# Patient Record
Sex: Female | Born: 1980
Health system: Southern US, Community
[De-identification: ages and names within clinical notes are randomized; demographics above are authoritative.]

## PROBLEM LIST (undated history)

## (undated) DIAGNOSIS — E282 Polycystic ovarian syndrome: Secondary | ICD-10-CM

## (undated) DIAGNOSIS — Z9289 Personal history of other medical treatment: Secondary | ICD-10-CM

## (undated) DIAGNOSIS — D649 Anemia, unspecified: Secondary | ICD-10-CM

## (undated) DIAGNOSIS — G4700 Insomnia, unspecified: Secondary | ICD-10-CM

## (undated) DIAGNOSIS — R51 Headache: Secondary | ICD-10-CM

## (undated) DIAGNOSIS — K219 Gastro-esophageal reflux disease without esophagitis: Secondary | ICD-10-CM

## (undated) HISTORY — DX: Insomnia, unspecified: G47.00

## (undated) HISTORY — PX: WISDOM TOOTH EXTRACTION: SHX21

## (undated) HISTORY — DX: Headache: R51

## (undated) HISTORY — PX: BREAST CYST ASPIRATION: SHX578

---

## 2002-09-25 HISTORY — PX: GASTRIC BYPASS: SHX52

## 2007-01-24 LAB — CONVERTED CEMR LAB: Pap Smear: NORMAL

## 2007-08-02 ENCOUNTER — Ambulatory Visit: Payer: Self-pay | Admitting: Nurse Practitioner

## 2007-08-02 DIAGNOSIS — R5381 Other malaise: Secondary | ICD-10-CM | POA: Insufficient documentation

## 2007-08-02 DIAGNOSIS — R5383 Other fatigue: Secondary | ICD-10-CM

## 2007-08-02 DIAGNOSIS — D638 Anemia in other chronic diseases classified elsewhere: Secondary | ICD-10-CM | POA: Insufficient documentation

## 2007-08-02 DIAGNOSIS — G4700 Insomnia, unspecified: Secondary | ICD-10-CM | POA: Insufficient documentation

## 2007-08-02 LAB — CONVERTED CEMR LAB
Eosinophils Relative: 1 % (ref 0–5)
HCT: 30.5 % — ABNORMAL LOW (ref 36.0–46.0)
Lymphocytes Relative: 53 % — ABNORMAL HIGH (ref 12–46)
Lymphs Abs: 1.7 10*3/uL (ref 0.7–3.3)
Monocytes Relative: 6 % (ref 3–11)
Neutro Abs: 1.3 10*3/uL — ABNORMAL LOW (ref 1.7–7.7)
Neutrophils Relative %: 39 % — ABNORMAL LOW (ref 43–77)
RBC: 4.06 M/uL (ref 3.87–5.11)
RDW: 18.3 % — ABNORMAL HIGH (ref 11.5–14.0)
Retic Ct Pct: 0.6 % (ref 0.4–3.1)
WBC: 3.3 10*3/uL — ABNORMAL LOW (ref 4.0–10.5)

## 2007-08-27 ENCOUNTER — Emergency Department (HOSPITAL_COMMUNITY): Admission: EM | Admit: 2007-08-27 | Discharge: 2007-08-27 | Payer: Self-pay | Admitting: Emergency Medicine

## 2007-09-09 ENCOUNTER — Ambulatory Visit: Payer: Self-pay | Admitting: Nurse Practitioner

## 2007-09-09 DIAGNOSIS — D519 Vitamin B12 deficiency anemia, unspecified: Secondary | ICD-10-CM | POA: Insufficient documentation

## 2007-09-10 LAB — CONVERTED CEMR LAB
Basophils Relative: 1 % (ref 0–1)
HCT: 30.6 % — ABNORMAL LOW (ref 36.0–46.0)
Lymphocytes Relative: 53 % — ABNORMAL HIGH (ref 12–46)
Lymphs Abs: 2.3 10*3/uL (ref 0.7–4.0)
MCHC: 29.4 g/dL — ABNORMAL LOW (ref 30.0–36.0)
Monocytes Absolute: 0.2 10*3/uL (ref 0.1–1.0)
Monocytes Relative: 5 % (ref 3–12)
Neutro Abs: 1.8 10*3/uL (ref 1.7–7.7)
RDW: 17.5 % — ABNORMAL HIGH (ref 11.5–15.5)

## 2007-10-25 ENCOUNTER — Ambulatory Visit: Payer: Self-pay | Admitting: Nurse Practitioner

## 2007-10-25 ENCOUNTER — Other Ambulatory Visit: Admission: RE | Admit: 2007-10-25 | Discharge: 2007-10-25 | Payer: Self-pay | Admitting: Family Medicine

## 2007-10-25 DIAGNOSIS — N760 Acute vaginitis: Secondary | ICD-10-CM | POA: Insufficient documentation

## 2007-10-25 LAB — CONVERTED CEMR LAB
Bilirubin Urine: NEGATIVE
Glucose, Urine, Semiquant: NEGATIVE
KOH Prep: NEGATIVE
Nitrite: NEGATIVE
Pap Smear: NORMAL
WBC Urine, dipstick: NEGATIVE

## 2007-10-28 ENCOUNTER — Telehealth (INDEPENDENT_AMBULATORY_CARE_PROVIDER_SITE_OTHER): Payer: Self-pay | Admitting: Nurse Practitioner

## 2007-10-28 ENCOUNTER — Encounter (INDEPENDENT_AMBULATORY_CARE_PROVIDER_SITE_OTHER): Payer: Self-pay | Admitting: Nurse Practitioner

## 2007-10-28 LAB — CONVERTED CEMR LAB
Basophils Absolute: 0 10*3/uL (ref 0.0–0.1)
Basophils Relative: 1 % (ref 0–1)
Eosinophils Relative: 1 % (ref 0–5)
GC Probe Amp, Genital: NEGATIVE
HCT: 31.8 % — ABNORMAL LOW (ref 36.0–46.0)
Hemoglobin: 9.5 g/dL — ABNORMAL LOW (ref 12.0–15.0)
Lymphocytes Relative: 46 % (ref 12–46)
Lymphs Abs: 2.3 10*3/uL (ref 0.7–4.0)
MCHC: 29.9 g/dL — ABNORMAL LOW (ref 30.0–36.0)
MCV: 75.7 fL — ABNORMAL LOW (ref 78.0–100.0)
Monocytes Absolute: 0.3 10*3/uL (ref 0.1–1.0)
Neutro Abs: 2.4 10*3/uL (ref 1.7–7.7)
Neutrophils Relative %: 48 % (ref 43–77)
RDW: 17.1 % — ABNORMAL HIGH (ref 11.5–15.5)
Vitamin B-12: 184 pg/mL — ABNORMAL LOW (ref 211–911)

## 2007-10-29 ENCOUNTER — Encounter (INDEPENDENT_AMBULATORY_CARE_PROVIDER_SITE_OTHER): Payer: Self-pay | Admitting: Nurse Practitioner

## 2007-11-04 ENCOUNTER — Telehealth (INDEPENDENT_AMBULATORY_CARE_PROVIDER_SITE_OTHER): Payer: Self-pay | Admitting: Nurse Practitioner

## 2007-11-05 ENCOUNTER — Telehealth (INDEPENDENT_AMBULATORY_CARE_PROVIDER_SITE_OTHER): Payer: Self-pay | Admitting: Nurse Practitioner

## 2008-01-30 ENCOUNTER — Ambulatory Visit: Payer: Self-pay | Admitting: Obstetrics and Gynecology

## 2008-03-17 ENCOUNTER — Emergency Department (HOSPITAL_COMMUNITY): Admission: EM | Admit: 2008-03-17 | Discharge: 2008-03-17 | Payer: Self-pay | Admitting: Emergency Medicine

## 2008-04-22 ENCOUNTER — Ambulatory Visit: Payer: Self-pay | Admitting: Obstetrics & Gynecology

## 2008-05-08 ENCOUNTER — Telehealth (INDEPENDENT_AMBULATORY_CARE_PROVIDER_SITE_OTHER): Payer: Self-pay | Admitting: Nurse Practitioner

## 2008-06-04 ENCOUNTER — Ambulatory Visit: Payer: Self-pay | Admitting: Nurse Practitioner

## 2008-06-04 DIAGNOSIS — L680 Hirsutism: Secondary | ICD-10-CM | POA: Insufficient documentation

## 2008-06-04 LAB — CONVERTED CEMR LAB
Basophils Absolute: 0 10*3/uL (ref 0.0–0.1)
Basophils Relative: 1 % (ref 0–1)
Eosinophils Absolute: 0 10*3/uL (ref 0.0–0.7)
Insulin: 4 microintl units/mL (ref 3–28)
Lymphocytes Relative: 45 % (ref 12–46)
Lymphs Abs: 1.8 10*3/uL (ref 0.7–4.0)
MCHC: 28.9 g/dL — ABNORMAL LOW (ref 30.0–36.0)
MCV: 77.9 fL — ABNORMAL LOW (ref 78.0–100.0)
Monocytes Absolute: 0.2 10*3/uL (ref 0.1–1.0)
Neutro Abs: 1.9 10*3/uL (ref 1.7–7.7)
Platelets: 337 10*3/uL (ref 150–400)
Sex Hormone Binding: 40 nmol/L (ref 18–114)
Vitamin B-12: 196 pg/mL — ABNORMAL LOW (ref 211–911)
WBC: 4 10*3/uL (ref 4.0–10.5)

## 2008-06-17 ENCOUNTER — Encounter (INDEPENDENT_AMBULATORY_CARE_PROVIDER_SITE_OTHER): Payer: Self-pay | Admitting: Nurse Practitioner

## 2008-10-12 ENCOUNTER — Encounter (INDEPENDENT_AMBULATORY_CARE_PROVIDER_SITE_OTHER): Payer: Self-pay | Admitting: Nurse Practitioner

## 2008-12-03 ENCOUNTER — Ambulatory Visit: Payer: Self-pay | Admitting: Obstetrics and Gynecology

## 2008-12-03 LAB — CONVERTED CEMR LAB: WBC, Wet Prep HPF POC: NONE SEEN

## 2008-12-28 ENCOUNTER — Emergency Department (HOSPITAL_COMMUNITY): Admission: EM | Admit: 2008-12-28 | Discharge: 2008-12-28 | Payer: Self-pay | Admitting: Emergency Medicine

## 2010-04-11 ENCOUNTER — Emergency Department (HOSPITAL_COMMUNITY): Admission: EM | Admit: 2010-04-11 | Discharge: 2010-04-11 | Payer: Self-pay | Admitting: Emergency Medicine

## 2010-04-26 ENCOUNTER — Inpatient Hospital Stay (HOSPITAL_COMMUNITY): Admission: AD | Admit: 2010-04-26 | Discharge: 2010-04-26 | Payer: Self-pay | Admitting: Obstetrics & Gynecology

## 2010-04-26 ENCOUNTER — Ambulatory Visit: Payer: Self-pay | Admitting: Gynecology

## 2011-01-20 ENCOUNTER — Emergency Department (HOSPITAL_COMMUNITY)
Admission: EM | Admit: 2011-01-20 | Discharge: 2011-01-20 | Disposition: A | Discharge status: Home / Self Care | Payer: PRIVATE HEALTH INSURANCE | Attending: Emergency Medicine | Admitting: Emergency Medicine

## 2011-01-20 DIAGNOSIS — N6459 Other signs and symptoms in breast: Secondary | ICD-10-CM | POA: Insufficient documentation

## 2011-01-20 DIAGNOSIS — N63 Unspecified lump in unspecified breast: Secondary | ICD-10-CM | POA: Insufficient documentation

## 2011-01-27 ENCOUNTER — Other Ambulatory Visit (HOSPITAL_COMMUNITY): Payer: Self-pay | Admitting: Obstetrics and Gynecology

## 2011-01-27 DIAGNOSIS — N6310 Unspecified lump in the right breast, unspecified quadrant: Secondary | ICD-10-CM

## 2011-01-30 ENCOUNTER — Other Ambulatory Visit (HOSPITAL_COMMUNITY): Payer: Self-pay | Admitting: Obstetrics and Gynecology

## 2011-01-30 ENCOUNTER — Ambulatory Visit
Admission: RE | Admit: 2011-01-30 | Discharge: 2011-01-30 | Disposition: A | Discharge status: Home / Self Care | Payer: PRIVATE HEALTH INSURANCE | Source: Ambulatory Visit | Attending: Obstetrics and Gynecology | Admitting: Obstetrics and Gynecology

## 2011-01-30 DIAGNOSIS — N6310 Unspecified lump in the right breast, unspecified quadrant: Secondary | ICD-10-CM

## 2011-01-31 ENCOUNTER — Other Ambulatory Visit (HOSPITAL_COMMUNITY): Payer: Self-pay | Admitting: Obstetrics and Gynecology

## 2011-01-31 ENCOUNTER — Ambulatory Visit
Admission: RE | Admit: 2011-01-31 | Discharge: 2011-01-31 | Disposition: A | Discharge status: Home / Self Care | Payer: PRIVATE HEALTH INSURANCE | Source: Ambulatory Visit | Attending: Obstetrics and Gynecology | Admitting: Obstetrics and Gynecology

## 2011-01-31 DIAGNOSIS — N6310 Unspecified lump in the right breast, unspecified quadrant: Secondary | ICD-10-CM

## 2011-02-07 NOTE — Group Therapy Note (Signed)
NAME:  Haley Martinez, EGGE NO.:  [id number]   MEDICAL RECORD NO.:  [id number]          PATIENT TYPE:  WOC   LOCATION:  [facility]                   FACILITY:  [facility]   PHYSICIAN:  Argentina Donovan, MD        DATE OF BIRTH:  Apr 06, 1981   DATE OF SERVICE:  12/03/2008                                  CLINIC NOTE   The patient is a 30 year old African American female who weighs 153  pounds and is 5 feet 5 inches tall.  She originally weighed 260 some  pounds.  She had a Roux-en-Y bypass where she was told she cannot any  longer trust the birth control pill.  She has tried NuvaRing and Mirena,  but because of side effects and/or financial reasons has stopped using  them.  She uses 100% use of condoms and has irritation and she thinks  that she may be allergic to the latex.  We have talked about that in  detail and found out today that it is true that you should not use oral  contraceptives after gastric bypass, so we have a problem with chronic  recurrent vaginal irritation, possibly due to the latex condoms.  I have  encouraged her to try the Lamb skin since she in a stable relationship  and it really is not a question or problem that she has to be concerned  about she says.  A wet prep was taken on examination.  External  genitalia was normal.  BUS within normal limits.  Vagina is clean and  well rugated with very little discharge.  The cervix is clean,  nulliparous and the uterus is anterior normal size, shape and  consistency.  The adnexa is normal.   IMPRESSION:  Vaginal irritation, possibly due to latex irritation.  I  told her to apply a Band-Aid to her arm or leg and see if she gets a  reaction to that, which is generally a latex preparation.  She is going  to try and do that and we will see whether she has any sign of  infection.           ______________________________  Argentina Donovan, MD     PR/MEDQ  D:  12/03/2008  T:  12/03/2008  Job:  [id number]

## 2011-02-07 NOTE — Group Therapy Note (Signed)
NAME:  Haley Martinez, Haley Martinez NO.:  [id number]   MEDICAL RECORD NO.:  [id number]          PATIENT TYPE:  WOC   LOCATION:  [facility]                   FACILITY:  [facility]   PHYSICIAN:  Johnella Moloney, MD        DATE OF BIRTH:  1981/02/07   DATE OF SERVICE:                                  CLINIC NOTE   The patient is 30 year old gravida 3, para 1-0-2-1 who was last seen on  Jan 29, 2098 for IUD removal and initiation of NuvaRing for birth  control.  The patient is here today reporting that since she has been on  NuvaRing, she has had increased weight gain of 7 pounds since May,  breast tenderness and some vaginal discharge.  The patient would like to  discuss alternative forms of birth control.  She is not interested in  taking any oral contraceptive pills and wants something either  transdermal or in the form of an ejection.  The patient has no other  concerns.   PHYSICAL EXAMINATION:  Temperature 98.7, blood pressure 117/74, weight  157.8 pounds, height 5 feet 5 inches.  GENERAL:  No apparent distress.  ABDOMEN:  Soft, nontender, nondistended.  PELVIC:  Examination with normal external female genitalia.  Small  amount of thick white discharge noted in vaginal vault.  No lesions.  Vagina was otherwise pink and well rugated.  Normal cervical contour.  No abnormal discharge noted.  Sample was taken for a wet prep of the  discharge.   ASSESSMENT/PLAN:  The patient is a 30 year old G3, P 1-0-2-1 here to  discuss issues with her current birth control.  She was told that it is  a normal side effect to have some weight gain or weight changes, breast  tenderness and also that vaginal discharge was common with the NuvaRing  given that it is as a foreign body in the vagina.  The patient was told  about other options including Depo-Provera and Implanon, but told that  these other modalities could also result in weight changes.  The patient  wants to continue the NuvaRing for now and  she also will use condoms for  STI prevention.  As for her vaginal discharge, was told that we will  follow up the results of the wet prep to rule out any pathology and she  should expect a call if the results are not normal.  The patient is to  follow up in the Gynecology Clinic for any further concerns.           ______________________________  Johnella Moloney, MD     UD/MEDQ  D:  04/22/2008  T:  04/22/2008  Job:  [id number]

## 2011-02-17 ENCOUNTER — Other Ambulatory Visit: Payer: Self-pay | Admitting: Obstetrics and Gynecology

## 2011-02-17 DIAGNOSIS — N6009 Solitary cyst of unspecified breast: Secondary | ICD-10-CM

## 2011-02-21 ENCOUNTER — Ambulatory Visit
Admission: RE | Admit: 2011-02-21 | Discharge: 2011-02-21 | Disposition: A | Discharge status: Home / Self Care | Payer: PRIVATE HEALTH INSURANCE | Source: Ambulatory Visit | Attending: Obstetrics and Gynecology | Admitting: Obstetrics and Gynecology

## 2011-02-21 ENCOUNTER — Other Ambulatory Visit: Payer: Self-pay | Admitting: Obstetrics and Gynecology

## 2011-02-21 DIAGNOSIS — N6009 Solitary cyst of unspecified breast: Secondary | ICD-10-CM

## 2011-03-07 ENCOUNTER — Other Ambulatory Visit: Payer: PRIVATE HEALTH INSURANCE

## 2011-03-10 ENCOUNTER — Other Ambulatory Visit: Payer: PRIVATE HEALTH INSURANCE

## 2011-03-30 ENCOUNTER — Other Ambulatory Visit: Payer: PRIVATE HEALTH INSURANCE

## 2011-04-03 ENCOUNTER — Ambulatory Visit
Admission: RE | Admit: 2011-04-03 | Discharge: 2011-04-03 | Disposition: A | Discharge status: Home / Self Care | Payer: PRIVATE HEALTH INSURANCE | Source: Ambulatory Visit | Attending: Obstetrics and Gynecology | Admitting: Obstetrics and Gynecology

## 2011-04-03 DIAGNOSIS — N6009 Solitary cyst of unspecified breast: Secondary | ICD-10-CM

## 2011-06-22 LAB — DIFFERENTIAL
Basophils Relative: 0
Blasts: 0
Monocytes Relative: 4
Myelocytes: 0
Promyelocytes Absolute: 0

## 2011-06-22 LAB — POCT I-STAT, CHEM 8
Chloride: 106
Creatinine, Ser: 0.8
Glucose, Bld: 82
HCT: 32 — ABNORMAL LOW
Hemoglobin: 10.9 — ABNORMAL LOW
Potassium: 3.9

## 2011-06-22 LAB — CBC
HCT: 29.6 — ABNORMAL LOW
Hemoglobin: 9.5 — ABNORMAL LOW
MCHC: 32.1
MCV: 75.5 — ABNORMAL LOW
RBC: 3.92
RDW: 19.1 — ABNORMAL HIGH
WBC: 4.6

## 2011-12-29 ENCOUNTER — Ambulatory Visit: Payer: PRIVATE HEALTH INSURANCE | Admitting: Family Medicine

## 2012-03-06 ENCOUNTER — Emergency Department (HOSPITAL_COMMUNITY)
Admission: EM | Admit: 2012-03-06 | Discharge: 2012-03-06 | Disposition: A | Discharge status: Home / Self Care | Payer: Medicaid Other | Attending: Emergency Medicine | Admitting: Emergency Medicine

## 2012-03-06 ENCOUNTER — Encounter (HOSPITAL_COMMUNITY): Payer: Self-pay | Admitting: Emergency Medicine

## 2012-03-06 DIAGNOSIS — Z9884 Bariatric surgery status: Secondary | ICD-10-CM | POA: Insufficient documentation

## 2012-03-06 DIAGNOSIS — N9089 Other specified noninflammatory disorders of vulva and perineum: Secondary | ICD-10-CM | POA: Insufficient documentation

## 2012-03-06 HISTORY — DX: Polycystic ovarian syndrome: E28.2

## 2012-03-06 MED ORDER — LIDOCAINE HCL 2 % EX GEL: CUTANEOUS | Status: AC | PRN

## 2012-03-06 NOTE — Discharge Instructions (Signed)
Your exam shows a fissure to your vulva. Sitz baths at home. Lidocaine topically for pain. No intercourse until all healed. Follow up with your doctor.

## 2012-03-06 NOTE — ED Provider Notes (Signed)
History     CSN: [id number]  Arrival date & time 03/06/12  1817   First MD Initiated Contact with Patient 03/06/12 1900      Chief Complaint  Patient presents with  . Vaginal Injury    (Consider location/radiation/quality/duration/timing/severity/associated sxs/prior treatment) Patient is a 31 y.o. female presenting with vaginal injury. The history is provided by the patient.  Vaginal Injury This is a new problem. The current episode started in the past 7 days. The problem occurs constantly. The problem has been unchanged. Pertinent negatives include no chills or fever.  Pt states she was having intercourse and felt pain in perineum. Reports pain now with palpation and unable to have intercourse. States saw some blood when wiping as well. No urinary symptoms. No abdominal pain. Normal bowel movements.   Past Medical History  Diagnosis Date  . Polycystic ovarian syndrome     Past Surgical History  Procedure Date  . Gastric bypass     History reviewed. No pertinent family history.  History  Substance Use Topics  . Smoking status: Never Smoker   . Smokeless tobacco: Not on file  . Alcohol Use: No    OB History    Grav Para Term Preterm Abortions TAB SAB Ect Mult Living                  Review of Systems  Constitutional: Negative for fever and chills.  Respiratory: Negative.   Cardiovascular: Negative.   Gastrointestinal: Negative for blood in stool.  Genitourinary: Positive for vaginal pain. Negative for dysuria, hematuria, vaginal bleeding, vaginal discharge and pelvic pain.  Skin: Positive for wound.    Allergies  Review of patient's allergies indicates no known allergies.  Home Medications   Current Outpatient Rx  Name Route Sig Dispense Refill  . IBUPROFEN 200 MG PO TABS Oral Take 400 mg by mouth every 8 (eight) hours as needed. For pain.      BP 112/69  Pulse 98  Temp 98.5 F (36.9 C) (Oral)  Resp 16  SpO2 100%  LMP 01/05/2012  Physical Exam    Nursing note and vitals reviewed. Constitutional: She is oriented to person, place, and time. She appears well-developed and well-nourished. No distress.  HENT:  Head: Normocephalic.  Eyes: Conjunctivae are normal.  Cardiovascular: Normal rate, regular rhythm and normal heart sounds.   Pulmonary/Chest: Effort normal and breath sounds normal. No respiratory distress. She has no wheezes.  Genitourinary:       Normal external genitlia, other than a 1cm fissure to the posterior vaginal opening over perineum. Hemostatic.  Neurological: She is alert and oriented to person, place, and time.  Skin: Skin is warm and dry.  Psychiatric: She has a normal mood and affect.    ED Course  Procedures (including critical care time)  Pt with perineal fissure. No bleeding at present. Fissure superficial, no repair required. Will treat with pain management, sitz baths, pelvic rest, follow up.  1. Fissure of vulva       MDM          Lottie Mussel, PA 03/07/12 [phone]

## 2012-03-06 NOTE — ED Notes (Signed)
Patient states that she was having sex with her partner 2 days ago and she feels like her labia has torn. She has had intermittent bleeding to the site since the injury

## 2012-03-08 NOTE — ED Provider Notes (Signed)
Medical screening examination/treatment/procedure(s) were performed by non-physician practitioner and as supervising physician I was immediately available for consultation/collaboration.  Flint Melter, MD 03/08/12 1002

## 2012-12-05 ENCOUNTER — Emergency Department (INDEPENDENT_AMBULATORY_CARE_PROVIDER_SITE_OTHER)
Admission: EM | Admit: 2012-12-05 | Discharge: 2012-12-05 | Disposition: A | Discharge status: Home / Self Care | Payer: Self-pay | Source: Home / Self Care

## 2012-12-05 ENCOUNTER — Encounter (HOSPITAL_COMMUNITY): Payer: Self-pay | Admitting: Emergency Medicine

## 2012-12-05 DIAGNOSIS — M778 Other enthesopathies, not elsewhere classified: Secondary | ICD-10-CM

## 2012-12-05 DIAGNOSIS — M79609 Pain in unspecified limb: Secondary | ICD-10-CM

## 2012-12-05 DIAGNOSIS — M79602 Pain in left arm: Secondary | ICD-10-CM

## 2012-12-05 DIAGNOSIS — M6580 Other synovitis and tenosynovitis, unspecified site: Secondary | ICD-10-CM

## 2012-12-05 HISTORY — DX: Anemia, unspecified: D64.9

## 2012-12-05 NOTE — ED Provider Notes (Signed)
History     CSN: [id number]  Arrival date & time 12/05/12  7829   First MD Initiated Contact with Patient 12/05/12 1854      Chief Complaint  Patient presents with  . Arm Pain    left arm pain    (Consider location/radiation/quality/duration/timing/severity/associated sxs/prior treatment) HPI Comments: 32 year old female who presents with a complaint of left antecubital fossa pain. This occurred at 4:00 this morning stating that her median pain was dilated and the area was cold to touch. There was also pain on the anterior surface of the elbow. This has occurred 2 other times in the remote past. The symptoms may last from 6-8 hours to 24 hours. She is currently not having any other symptoms. She has a history of anemia and  PCOS  Patient is a 32 y.o. female presenting with arm pain.  Arm Pain    Past Medical History  Diagnosis Date  . Polycystic ovarian syndrome   . Anemia     Past Surgical History  Procedure Laterality Date  . Gastric bypass      History reviewed. No pertinent family history.  History  Substance Use Topics  . Smoking status: Never Smoker   . Smokeless tobacco: Not on file  . Alcohol Use: No    OB History   Grav Para Term Preterm Abortions TAB SAB Ect Mult Living                  Review of Systems  Constitutional: Negative.   Respiratory: Negative.   Musculoskeletal: Positive for arthralgias.  Neurological: Negative.   Psychiatric/Behavioral: Negative.     Allergies  Review of patient's allergies indicates no known allergies.  Home Medications   Current Outpatient Rx  Name  Route  Sig  Dispense  Refill  . ibuprofen (ADVIL,MOTRIN) 200 MG tablet   Oral   Take 400 mg by mouth every 8 (eight) hours as needed. For pain.         Marland Kitchen lidocaine (XYLOCAINE JELLY) 2 % jelly   Topical   Apply topically as needed.   30 mL   0     BP 119/73  Pulse 74  Temp(Src) 98.6 F (37 C) (Oral)  Resp 16  SpO2 100%  LMP 09/25/2012  Physical  Exam  Nursing note and vitals reviewed. Constitutional: She is oriented to person, place, and time. She appears well-developed and well-nourished. No distress.  HENT:  Head: Normocephalic and atraumatic.  Eyes: EOM are normal.  Pulmonary/Chest: Effort normal. No respiratory distress.  Musculoskeletal:  Examination of left upper extremity is unremarkable. A. the only finding is mild enlargement of the antecubital median pain. Is also mild tenderness in the outset tendon. The skin is of note normal color and temperature. Distal neurovascular motor sensory is intact. Radial pulses 2+. There is no tenderness in the bilateral epicondyles, olecranon or other soft tissues. Full range of motion of the elbow and strength is 5 over 5.  Neurological: She is alert and oriented to person, place, and time. No cranial nerve deficit.  Skin: Skin is warm and dry.  Psychiatric: She has a normal mood and affect.    ED Course  Procedures (including critical care time)  Labs Reviewed - No data to display No results found.   1. Tendinitis of left elbow   2. Arm pain, anterior, left       MDM  Diagnosis is uncertain. She does have tenderness in the biceps tendon in the antecubital fossa. The exam  of the left elbow, upper  arm and forearm are normal with exception of the above tendinitis.Circulation is normal. Recommend that if the symptoms of coldness, vein dilatation and pain recurs to go to emergency department.      Hayden Rasmussen, NP 12/05/12 1949

## 2012-12-05 NOTE — ED Notes (Signed)
Pt states that she has a hx of irregular cycles and last cycle was in January.

## 2012-12-05 NOTE — ED Notes (Signed)
Pt c/o left arm pain that started today at 4 a.m. Pt states that the pain is severe and vein is bulging.  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

## 2012-12-06 NOTE — ED Provider Notes (Signed)
Medical screening examination/treatment/procedure(s) were performed by non-physician practitioner and as supervising physician I was immediately available for consultation/collaboration.  Leslee Home, M.D.  Reuben Likes, MD 12/06/12 [phone]

## 2014-01-05 ENCOUNTER — Ambulatory Visit: Payer: Self-pay | Attending: Internal Medicine

## 2014-04-17 ENCOUNTER — Encounter: Payer: Self-pay | Admitting: Nurse Practitioner

## 2014-04-17 ENCOUNTER — Ambulatory Visit (INDEPENDENT_AMBULATORY_CARE_PROVIDER_SITE_OTHER): Payer: Self-pay | Admitting: Nurse Practitioner

## 2014-04-17 VITALS — BP 104/52 | HR 71 | Temp 98.4°F | Ht 65.5 in | Wt 151.2 lb

## 2014-04-17 DIAGNOSIS — Z01419 Encounter for gynecological examination (general) (routine) without abnormal findings: Secondary | ICD-10-CM

## 2014-04-17 DIAGNOSIS — Z0000 Encounter for general adult medical examination without abnormal findings: Secondary | ICD-10-CM

## 2014-04-17 DIAGNOSIS — Z9189 Other specified personal risk factors, not elsewhere classified: Secondary | ICD-10-CM | POA: Insufficient documentation

## 2014-04-17 DIAGNOSIS — Z9884 Bariatric surgery status: Secondary | ICD-10-CM

## 2014-04-17 NOTE — Progress Notes (Signed)
History:  Haley Martinez is a 33 y.o. [phone] who presents to [facility] clinic today for well woman exam. She is currently sexually active with same partner, does not use birth control and does not want to use anything. She has PCOS and an irregular cycle. She may have 4-6 cycles per year. She had an abnormal pap in her teens, otherwise they have all been normal. She has had cyst in her breast removed. She denies any current issues with vagina or breasts. She had gastric bypass 9 years ago and has done a great job keeping the weight off.  The following portions of the patient's history were reviewed and updated as appropriate: allergies, current medications, past family history, past medical history, past social history, past surgical history and problem list.  Review of Systems:    Objective:  Physical Exam BP 104/52  Pulse 71  Temp(Src) 98.4 F (36.9 C) (Oral)  Ht 5' 5.5" (1.664 m)  Wt 151 lb 3.2 oz (68.584 kg)  BMI 24.77 kg/m2  LMP 03/15/2014 GENERAL: Well-developed, well-nourished female in no acute distress.  HEENT: Normocephalic, atraumatic.  NECK: Supple. Normal thyroid.  LUNGS: Normal rate. Clear to auscultation bilaterally.  HEART: Regular rate and rhythm with no adventitious sounds.  BREASTS: Symmetric in size. No masses, skin changes, nipple drainage, or lymphadenopathy. ABDOMEN: Soft, nontender, nondistended. No organomegaly. Normal bowel sounds appreciated in all quadrants.  PELVIC: Normal external female genitalia. Vagina is pink and rugated.  Discharge is thick white and clumpy. Normal cervix contour. Pap smear obtained. Uterus is normal in size. No adnexal mass or tenderness.  EXTREMITIES: No cyanosis, clubbing, or edema, 2+ distal pulses.   Labs and Imaging No results found.  Assessment & Plan:  Assessment: Well woman Exam Vaginal Yeast  Plans: Pap, wet prep, GC/ Chlamydia Will follow up with results  Olegario Messier, NP 04/17/2014 9:51 AM

## 2014-04-17 NOTE — Patient Instructions (Signed)
Pap Test A Pap test checks the cells on the surface of your cervix. Your doctor will look for cell changes that are not normal, an infection, or cancer. If the cells no longer look normal, it is called dysplasia. Dysplasia can turn into cancer. Regular Pap tests are important to stop cancer from developing. BEFORE THE PROCEDURE  Ask your doctor when to schedule your Pap test. Timing the test around your period may be important.  Do not douche or have sex (intercourse) for 24 hours before the test.  Do not put creams on your vagina or use tampons for 24 hours before the test.  Go pee (urinate) just before the test. PROCEDURE  You will lie on an exam table with your feet in stirrups.  A warm metal or plastic tool (speculum) will be put in your vagina to open it up.  Your doctor will use a small, plastic brush or wooden spatula to take cells from your cervix.  The cells will be put in a lab container.  The cells will be checked under a microscope to see if they are normal or not. AFTER THE PROCEDURE Get your test results. If they are abnormal, you may need more tests. Document Released: 10/14/2010 Document Revised: 12/04/2011 Document Reviewed: 09/07/2011 Exit[employer] Patient Information 2015 [employer]. This information is not intended to replace advice given to you by your health care provider. Make sure you discuss any questions you have with your health care provider.  

## 2014-04-18 LAB — WET PREP, GENITAL
Clue Cells Wet Prep HPF POC: NONE SEEN
Trich, Wet Prep: NONE SEEN
YEAST WET PREP: NONE SEEN

## 2014-04-21 LAB — CYTOLOGY - PAP

## 2014-04-24 ENCOUNTER — Telehealth: Payer: Self-pay | Admitting: *Deleted

## 2014-04-24 NOTE — Telephone Encounter (Signed)
Haley Martinez called and left a message stating she was seen last week and had a papsmear/ well check up and states provider talked to her about getting a birth control pill to help regulate her period but she declined.  But states now she wants to know if it would help with the cramping and heavy bleeding she has- states if it does she would like to get it. Called Haley Martinez and we discussed it would help with cramps and bleeding.  She does not have insurance and would want something from [facility]. Then Haley Martinez did voice that she has had gastric bypass and her surgeon had told her she would need to find another method of birth control because her system would not be able to absorb it.  I informed her that  There are other options, and I would route this message to her provider and see what she reccomends and that she may need to come in for appt to discuss. Informed her we will call her after we hear back from provider.

## 2014-04-25 DIAGNOSIS — Z9289 Personal history of other medical treatment: Secondary | ICD-10-CM

## 2014-04-25 HISTORY — DX: Personal history of other medical treatment: Z92.89

## 2014-05-05 NOTE — Telephone Encounter (Addendum)
Pt left new message stating that she has not heard a response to her questions about birth control from her call on 7/31/.  Please call back. She can be reached at [phone].  8/12  1645  Spoke w/Dr. Roselie Awkward regarding pt's question. He stated that pt may try Nuvaring if she would prefer or she may contact her General Surgeon for other birth control suggestions/advice.  Diane Day RNC

## 2014-05-08 NOTE — Telephone Encounter (Signed)
Called Haley Martinez and notified her we had spoke with 2 different providers and they state she can probably have the IUD, nuvaring, or depo-provera but advise her to ask her GI which would they recommend.  She states that her GI provider had told her IUd was best and she had one before but had to take it out once it had been in 5 years.  She is interested in getting another IUD, but does not have insurance , only has orange card- I informed her that does not cover IUD- but she can fill out application for IUD thru [employer] and will need to provide proof of income. Also informed her she would still be resposible for insertion fee which is around $200. She will come by on Tuesday 8/18 1:03 to fill out application and pick up letter re: proof of income.

## 2014-05-08 NOTE — Telephone Encounter (Signed)
Patient called stating she has called three times in the past 2 weeks and she got a callback the first time but hasn't heard anything back in response about her birth control or about her test results and would like a call back today about this and to see if she needs to come in and be seen. Left call back # [phone]. Called patient, no answer- left message that I am trying to return your phone call, please call us back at the clinics

## 2014-05-12 ENCOUNTER — Encounter: Payer: Self-pay | Admitting: Internal Medicine

## 2014-05-12 ENCOUNTER — Ambulatory Visit: Payer: Self-pay | Attending: Internal Medicine | Admitting: Internal Medicine

## 2014-05-12 VITALS — BP 128/84 | HR 70 | Temp 99.0°F | Resp 17 | Wt 151.0 lb

## 2014-05-12 DIAGNOSIS — N926 Irregular menstruation, unspecified: Secondary | ICD-10-CM | POA: Insufficient documentation

## 2014-05-12 DIAGNOSIS — D649 Anemia, unspecified: Secondary | ICD-10-CM | POA: Insufficient documentation

## 2014-05-12 DIAGNOSIS — Z139 Encounter for screening, unspecified: Secondary | ICD-10-CM

## 2014-05-12 DIAGNOSIS — N920 Excessive and frequent menstruation with regular cycle: Secondary | ICD-10-CM | POA: Insufficient documentation

## 2014-05-12 DIAGNOSIS — Z9884 Bariatric surgery status: Secondary | ICD-10-CM | POA: Insufficient documentation

## 2014-05-12 DIAGNOSIS — D509 Iron deficiency anemia, unspecified: Secondary | ICD-10-CM | POA: Insufficient documentation

## 2014-05-12 DIAGNOSIS — N921 Excessive and frequent menstruation with irregular cycle: Secondary | ICD-10-CM

## 2014-05-12 DIAGNOSIS — G4700 Insomnia, unspecified: Secondary | ICD-10-CM | POA: Insufficient documentation

## 2014-05-12 LAB — LIPID PANEL
CHOL/HDL RATIO: 2.6 ratio
CHOLESTEROL: 179 mg/dL (ref 0–200)
HDL: 68 mg/dL (ref >39)
LDL Cholesterol: 98 mg/dL (ref 0–99)
TRIGLYCERIDES: 67 mg/dL (ref <150)
VLDL: 13 mg/dL (ref 0–40)

## 2014-05-12 LAB — COMPLETE METABOLIC PANEL WITH GFR
ALK PHOS: 59 U/L (ref 39–117)
ALT: 19 U/L (ref 0–35)
AST: 27 U/L (ref 0–37)
Albumin: 4.3 g/dL (ref 3.5–5.2)
BILIRUBIN TOTAL: 0.7 mg/dL (ref 0.2–1.2)
BUN: 6 mg/dL (ref 6–23)
CALCIUM: 8.7 mg/dL (ref 8.4–10.5)
CHLORIDE: 102 meq/L (ref 96–112)
CO2: 27 mEq/L (ref 19–32)
CREATININE: 0.61 mg/dL (ref 0.50–1.10)
GFR, Est African American: >89 mL/min
GFR, Est Non African American: >89 mL/min
Glucose, Bld: 82 mg/dL (ref 70–99)
Potassium: 4.5 mEq/L (ref 3.5–5.3)
Sodium: 136 mEq/L (ref 135–145)
Total Protein: 7.2 g/dL (ref 6.0–8.3)

## 2014-05-12 LAB — TSH: TSH: 1.185 u[IU]/mL (ref 0.350–4.500)

## 2014-05-12 MED ORDER — TRAZODONE HCL 50 MG PO TABS: 25.0000 mg | ORAL_TABLET | Freq: Every evening | ORAL | Status: DC | PRN

## 2014-05-12 NOTE — Progress Notes (Signed)
Patient Demographics  Haley Martinez, is a 33 y.o. female  UVO:[id number]  VQQ:[id number]  DOB - [birth year]  CC:  Chief Complaint  Patient presents with  . Establish Care       HPI: Haley Martinez is a 33 y.o. female here today to establish medical care. Patient has history of gastric bypass and subsequent  anemia as per patient she cannot tolerate iron supplement, she was advised in the past to see a hematologist, as per patient due to insurance issues she has not been able, she denies any headache or dizziness does complain of craving for ice and also reported menorrhagia she, she is following up with gynecologist and is in the process to get Mirena insertion. Patient also reported to have lot of problem with the sleep, she has tried over-the-counter medications which does not help. Patient has No headache, No chest pain, No abdominal pain - No Nausea, No new weakness tingling or numbness, No Cough - SOB.  No Known Allergies Past Medical History  Diagnosis Date  . Polycystic ovarian syndrome   . Anemia   . Insomnia   . Restless leg syndrome    Current Outpatient Prescriptions on File Prior to Visit  Medication Sig Dispense Refill  . ibuprofen (ADVIL,MOTRIN) 200 MG tablet Take 400 mg by mouth every 8 (eight) hours as needed. For pain.       No current facility-administered medications on file prior to visit.   Family History  Problem Relation Age of Onset  . Heart disease Mother   . Hyperlipidemia Mother   . Hypertension Mother   . Hyperlipidemia Father   . Hypertension Father   . Cancer Maternal Uncle   . Drug abuse Maternal Grandmother   . Drug abuse Paternal Grandmother   . Stroke Paternal Grandfather    History   Social History  . Marital Status: Single    Spouse Name: N/A    Number of Children: N/A  . Years of Education: N/A   Occupational History  . Not on file.   Social History Main Topics  . Smoking status: Never Smoker   . Smokeless tobacco: Never  Used  . Alcohol Use: Yes     Comment: occasionally  . Drug Use: No  . Sexual Activity: Yes    Birth Control/ Protection: None   Other Topics Concern  . Not on file   Social History Narrative  . No narrative on file    Review of Systems: Constitutional: Negative for fever, chills, diaphoresis, activity change, appetite change and fatigue. HENT: Negative for ear pain, nosebleeds, congestion, facial swelling, rhinorrhea, neck pain, neck stiffness and ear discharge.  Eyes: Negative for pain, discharge, redness, itching and visual disturbance. Respiratory: Negative for cough, choking, chest tightness, shortness of breath, wheezing and stridor.  Cardiovascular: Negative for chest pain, palpitations and leg swelling. Gastrointestinal: Negative for abdominal distention. Genitourinary: Negative for dysuria, urgency, frequency, hematuria, flank pain, decreased urine volume, difficulty urinating and dyspareunia.  Musculoskeletal: Negative for back pain, joint swelling, arthralgia and gait problem. Neurological: Negative for dizziness, tremors, seizures, syncope, facial asymmetry, speech difficulty, weakness, light-headedness, numbness and headaches.  Hematological: Negative for adenopathy. Does not bruise/bleed easily. Psychiatric/Behavioral: Negative for hallucinations, behavioral problems, confusion, dysphoric mood, decreased concentration and agitation.    Objective:   Filed Vitals:   05/12/14 1531  BP: 128/84  Pulse: 70  Temp: 99 F (37.2 C)  Resp: 17    Physical Exam: Constitutional: Patient appears well-developed and well-nourished. No distress.  HENT: Normocephalic, atraumatic, External right and left ear normal. Oropharynx is clear and moist.  Eyes: Conjunctivae and EOM are normal. PERRLA, no scleral icterus. Neck: Normal ROM. Neck supple. No JVD. No tracheal deviation. No thyromegaly. CVS: RRR, S1/S2 +, no murmurs, no gallops, no carotid bruit.  Pulmonary: Effort and breath  sounds normal, no stridor, rhonchi, wheezes, rales.  Abdominal: Soft. BS +, no distension, tenderness, rebound or guarding.  Musculoskeletal: Normal range of motion. No edema and no tenderness.  Neuro: Alert. Normal reflexes, muscle tone coordination. No cranial nerve deficit. Skin: Skin is warm and dry. No rash noted. Not diaphoretic. No erythema. No pallor. Psychiatric: Normal mood and affect. Behavior, judgment, thought content normal.  Lab Results  Component Value Date   WBC 4.0 06/04/2008   HGB 9.2* 06/04/2008   HCT 31.8* 06/04/2008   MCV 77.9* 06/04/2008   PLT 337 06/04/2008   Lab Results  Component Value Date   CREATININE 0.8 03/17/2008   BUN 9 03/17/2008   NA 139 03/17/2008   K 3.9 03/17/2008   CL 106 03/17/2008    No results found for this basename: HGBA1C   Lipid Panel  No results found for this basename: chol, trig, hdl, cholhdl, vldl, ldlcalc       Assessment and plan:   1. Anemia, unspecified/2. History of gastric bypass Since she has history of gastric bypass with a problem with absorption, she probably needs  iron infusion, I have referred her to hematology. - Ambulatory referral to Hematology - Will repeat her CBC with Differential   3. Menorrhagia with irregular cycle Following up with her gynecologist.  4. Screening Ordered baseline blood work. - COMPLETE METABOLIC PANEL WITH GFR - TSH - Lipid panel - Vit D  25 hydroxy (rtn osteoporosis monitoring)  5. Insomnia Advised patient for sleep hygiene, prescribed positive to use when necessary. - traZODone (DESYREL) 50 MG tablet; Take 0.5-1 tablets (25-50 mg total) by mouth at bedtime as needed for sleep.  Dispense: 30 tablet; Refill: 3  Return in about 3 months (around 08/12/2014) for anemia.   Lorayne Marek, MD

## 2014-05-12 NOTE — Progress Notes (Signed)
Patient here to establish care Has history of gastric by pass Restless leg syndrome and anemia

## 2014-05-13 ENCOUNTER — Telehealth: Payer: Self-pay

## 2014-05-13 LAB — CBC WITH DIFFERENTIAL/PLATELET
BASOS PCT: 1 % (ref 0–1)
Basophils Absolute: 0 10*3/uL (ref 0.0–0.1)
EOS ABS: 0 10*3/uL (ref 0.0–0.7)
EOS PCT: 1 % (ref 0–5)
HEMATOCRIT: 22.6 % — AB (ref 36.0–46.0)
Hemoglobin: 6.4 g/dL — CL (ref 12.0–15.0)
Lymphocytes Relative: 66 % — ABNORMAL HIGH (ref 12–46)
Lymphs Abs: 2.8 10*3/uL (ref 0.7–4.0)
MCH: 18.5 pg — AB (ref 26.0–34.0)
MCHC: 28.3 g/dL — AB (ref 30.0–36.0)
MCV: 65.3 fL — AB (ref 78.0–100.0)
MONO ABS: 0.2 10*3/uL (ref 0.1–1.0)
Monocytes Relative: 4 % (ref 3–12)
Neutro Abs: 1.2 10*3/uL — ABNORMAL LOW (ref 1.7–7.7)
Neutrophils Relative %: 28 % — ABNORMAL LOW (ref 43–77)
Platelets: 73 10*3/uL — ABNORMAL LOW (ref 150–400)
RBC: 3.46 MIL/uL — ABNORMAL LOW (ref 3.87–5.11)
RDW: 25.6 % — ABNORMAL HIGH (ref 11.5–15.5)
WBC: 4.3 10*3/uL (ref 4.0–10.5)

## 2014-05-13 LAB — VITAMIN D 25 HYDROXY (VIT D DEFICIENCY, FRACTURES): Vit D, 25-Hydroxy: <10 ng/mL — ABNORMAL LOW (ref 30–89)

## 2014-05-13 NOTE — Telephone Encounter (Signed)
Spoke with patient this am about her hemoglobin Patient has a history of chronic anemia Patient complains of still feeling very tired Advised her is she is not feeling any better , to go to the ED Will most likely need a blood transfusion or IV iron per Dr Annitta Needs Referral coordinator is working on getting her seen by hematologist ASAP

## 2014-05-13 NOTE — Telephone Encounter (Signed)
Spoke with patient in reference to her oncology appt Haley Martinez our referral coordinator put it in as urgent request and  Their office should be calling patient today to schedule her an appointment

## 2014-05-13 NOTE — Telephone Encounter (Signed)
Message copied by Dorothe Pea on Wed May 13, 2014  9:38 AM ------      Message from: Lorayne Marek      Created: Wed May 13, 2014  9:31 AM       Call and let the patient know that her hemoglobin is low 6.4 compared to 5 years ago it was 9.2, she has history of gastric bypass and has chronic anemia, patient was seen yesterday, help patient to schedule appointment with hematology as soon as possible. If she has any worsening symptoms advise patient to go to the ER then she will need to get blood transfusion.       noticed low vitamin D, call patient advise to start ergocalciferol 50,000 units once a week for the duration of  12 weeks.            Marland Kitchen ------

## 2014-05-14 ENCOUNTER — Other Ambulatory Visit (HOSPITAL_COMMUNITY)
Admission: RE | Admit: 2014-05-14 | Discharge: 2014-05-14 | Disposition: A | Discharge status: Home / Self Care | Payer: Self-pay | Source: Ambulatory Visit | Attending: Hematology and Oncology | Admitting: Hematology and Oncology

## 2014-05-14 ENCOUNTER — Encounter: Payer: Self-pay | Admitting: Hematology and Oncology

## 2014-05-14 ENCOUNTER — Telehealth: Payer: Self-pay | Admitting: *Deleted

## 2014-05-14 ENCOUNTER — Encounter: Payer: Self-pay | Admitting: *Deleted

## 2014-05-14 ENCOUNTER — Telehealth: Payer: Self-pay | Admitting: Hematology and Oncology

## 2014-05-14 ENCOUNTER — Ambulatory Visit (HOSPITAL_BASED_OUTPATIENT_CLINIC_OR_DEPARTMENT_OTHER): Payer: Self-pay | Admitting: Hematology and Oncology

## 2014-05-14 ENCOUNTER — Non-Acute Institutional Stay (HOSPITAL_COMMUNITY)
Admission: AD | Admit: 2014-05-14 | Discharge: 2014-05-14 | Disposition: A | Discharge status: Home / Self Care | Payer: Self-pay | Source: Ambulatory Visit | Attending: Hematology and Oncology | Admitting: Hematology and Oncology

## 2014-05-14 ENCOUNTER — Ambulatory Visit: Payer: Self-pay

## 2014-05-14 ENCOUNTER — Ambulatory Visit (HOSPITAL_COMMUNITY)
Admission: RE | Admit: 2014-05-14 | Discharge: 2014-05-14 | Disposition: A | Discharge status: Home / Self Care | Payer: Self-pay | Source: Ambulatory Visit | Attending: Hematology and Oncology | Admitting: Hematology and Oncology

## 2014-05-14 ENCOUNTER — Ambulatory Visit (HOSPITAL_BASED_OUTPATIENT_CLINIC_OR_DEPARTMENT_OTHER): Payer: Self-pay | Admitting: Lab

## 2014-05-14 VITALS — BP 120/61 | HR 75 | Temp 98.8°F | Resp 18 | Ht 65.5 in | Wt 154.0 lb

## 2014-05-14 DIAGNOSIS — E538 Deficiency of other specified B group vitamins: Secondary | ICD-10-CM

## 2014-05-14 DIAGNOSIS — E559 Vitamin D deficiency, unspecified: Secondary | ICD-10-CM | POA: Insufficient documentation

## 2014-05-14 DIAGNOSIS — D638 Anemia in other chronic diseases classified elsewhere: Secondary | ICD-10-CM | POA: Insufficient documentation

## 2014-05-14 DIAGNOSIS — K5900 Constipation, unspecified: Secondary | ICD-10-CM | POA: Insufficient documentation

## 2014-05-14 DIAGNOSIS — D509 Iron deficiency anemia, unspecified: Secondary | ICD-10-CM

## 2014-05-14 DIAGNOSIS — K5901 Slow transit constipation: Secondary | ICD-10-CM

## 2014-05-14 DIAGNOSIS — G2581 Restless legs syndrome: Secondary | ICD-10-CM | POA: Insufficient documentation

## 2014-05-14 DIAGNOSIS — Z9884 Bariatric surgery status: Secondary | ICD-10-CM

## 2014-05-14 LAB — CBC & DIFF AND RETIC
BASO%: 0.3 % (ref 0.0–2.0)
BASOS ABS: 0 10*3/uL (ref 0.0–0.1)
EOS%: 0.9 % (ref 0.0–7.0)
Eosinophils Absolute: 0 10*3/uL (ref 0.0–0.5)
HEMATOCRIT: 23.7 % — AB (ref 34.8–46.6)
HEMOGLOBIN: 6.4 g/dL — AB (ref 11.6–15.9)
IMMATURE RETIC FRACT: 24.8 % — AB (ref 1.60–10.00)
LYMPH%: 52.3 % — ABNORMAL HIGH (ref 14.0–49.7)
MCH: 18.8 pg — ABNORMAL LOW (ref 25.1–34.0)
MCHC: 27 g/dL — ABNORMAL LOW (ref 31.5–36.0)
MCV: 69.7 fL — ABNORMAL LOW (ref 79.5–101.0)
MONO#: 0.2 10*3/uL (ref 0.1–0.9)
MONO%: 4.9 % (ref 0.0–14.0)
NEUT#: 1.5 10*3/uL (ref 1.5–6.5)
NEUT%: 41.6 % (ref 38.4–76.8)
Platelets: 45 10*3/uL — ABNORMAL LOW (ref 145–400)
RBC: 3.4 10*6/uL — ABNORMAL LOW (ref 3.70–5.45)
RDW: 26 % — AB (ref 11.2–14.5)
Retic %: 0.72 % (ref 0.70–2.10)
Retic Ct Abs: 24.48 10*3/uL — ABNORMAL LOW (ref 33.70–90.70)
WBC: 3.5 10*3/uL — ABNORMAL LOW (ref 3.9–10.3)
lymph#: 1.8 10*3/uL (ref 0.9–3.3)

## 2014-05-14 LAB — ABO/RH: ABO/RH(D): O POS

## 2014-05-14 LAB — HOLD TUBE, BLOOD BANK

## 2014-05-14 LAB — CHCC SMEAR

## 2014-05-14 LAB — PREPARE RBC (CROSSMATCH)

## 2014-05-14 MED ORDER — POLYETHYLENE GLYCOL 3350 17 G PO PACK: 17.0000 g | PACK | Freq: Every day | ORAL | Status: DC

## 2014-05-14 MED ORDER — HEPARIN SOD (PORK) LOCK FLUSH 100 UNIT/ML IV SOLN: 250.0000 [IU] | INTRAVENOUS | Status: DC | PRN

## 2014-05-14 MED ORDER — SODIUM CHLORIDE 0.9 % IV SOLN: 250.0000 mL | Freq: Once | INTRAVENOUS | Status: DC

## 2014-05-14 MED ORDER — SODIUM CHLORIDE 0.9 % IJ SOLN: 10.0000 mL | INTRAMUSCULAR | Status: DC | PRN

## 2014-05-14 MED ORDER — SODIUM CHLORIDE 0.9 % IJ SOLN: 3.0000 mL | INTRAMUSCULAR | Status: DC | PRN

## 2014-05-14 MED ORDER — HEPARIN SOD (PORK) LOCK FLUSH 100 UNIT/ML IV SOLN: 500.0000 [IU] | Freq: Every day | INTRAVENOUS | Status: DC | PRN

## 2014-05-14 MED ORDER — SODIUM CHLORIDE 0.9 % IV SOLN
1020.0000 mg | Freq: Once | INTRAVENOUS | Status: AC
  Administered 2014-05-14: 1020 mg via INTRAVENOUS
  Filled 2014-05-14: qty 34

## 2014-05-14 MED ADMIN — Cyanocobalamin Inj 1000 MCG/ML: 1000 ug | INTRAMUSCULAR | NDC 63323004401

## 2014-05-14 MED FILL — Cyanocobalamin Inj 1000 MCG/ML: INTRAMUSCULAR | Qty: 1 | Status: AC

## 2014-05-14 NOTE — Progress Notes (Signed)
Checked in new pt with no financial concerns. °

## 2014-05-14 NOTE — Telephone Encounter (Signed)
Per LPN I have called and scheduled patinet for injection tomorrow. Patient aware of time    JMW

## 2014-05-14 NOTE — Telephone Encounter (Signed)
Pt confirmed labs/ov per 08/20 POF, cld Sharyn Lull to add 1 unit of blood & IV iron today...  gave pt AVS....KJ

## 2014-05-14 NOTE — Telephone Encounter (Signed)
S/W PATIENT AND GAVE NP APPT FOR 08/20 @ 11:15 W/DR. [place]

## 2014-05-14 NOTE — Assessment & Plan Note (Signed)
We discussed some of the risks, benefits, and alternatives of blood transfusions. The patient is symptomatic from anemia and the hemoglobin level is critically low.  Some of the side-effects to be expected including risks of transfusion reactions, chills, infection, syndrome of volume overload and risk of hospitalization from various reasons and the patient is willing to proceed and went ahead to sign consent today. I will proceed with one unit of blood transfusion. The most likely cause of her anemia is due to chronic blood loss/malabsorption syndrome and mineral deficiencies. We discussed some of the risks, benefits, and alternatives of intravenous iron infusions. The patient is symptomatic from anemia and the iron level is critically low. She tolerated oral iron supplement poorly and desires to achieved higher levels of iron faster for adequate hematopoesis. Some of the side-effects to be expected including risks of infusion reactions, phlebitis, headaches, nausea and fatigue.  The patient is willing to proceed. Patient education material was dispensed.  Goal is to keep ferritin level greater than 50

## 2014-05-14 NOTE — Assessment & Plan Note (Addendum)
She has multiple mineral deficiencies. In addition to vitamin B12 and iron replacement, I recommend high-dose multivitamin supplements.

## 2014-05-14 NOTE — H&P (Signed)
She is here for transfusion only 

## 2014-05-14 NOTE — Progress Notes (Signed)
[name] CONSULT NOTE  Patient Care Team: Lorayne Marek, MD as PCP - General (Internal Medicine) Heath Lark, MD as Consulting Physician (Hematology and Oncology)  CHIEF COMPLAINTS/PURPOSE OF CONSULTATION:  Severe anemia and mineral deficiency  HISTORY OF PRESENTING ILLNESS:  Haley Martinez 33 y.o. female is here because of abnormal blood work.  She was found to have abnormal CBC from recent blood work from her primary care physician's office. The total white blood cell count was 4.3, hemoglobin 6.4, MCV of 65.3 and platelet count of 73,000. She also has severe vitamin D deficiency, less than 10. She denies chest pain but she has severe shortness of breath on minimal exertion, pre-syncopal episodes, palpitations leg cramps and restless leg syndrome. She had not noticed any recent bleeding such as epistaxis, hematuria or hematochezia. She has heavy menstruation. The patient denies over the counter NSAID ingestion. She is not on antiplatelets agents. She has history of gastric bypass surgery 9 what years ago and has lost 110 pounds of weight. She new of prior history of mineral deficiency is but were not taking any form of vitamin replacement therapy consistently. She had no prior history or diagnosis of cancer. Her age appropriate screening programs are up-to-date. She has excessive pica with chewing of ice. She eats a variety of diet. She never donated blood. She had received blood transfusion in the past during her gastric bypass surgery. The patient was prescribed oral iron supplements but she is not taking it due to severe diarrhea with oral iron supplement The patient also complained of insomnia. She is constipated for one week.  MEDICAL HISTORY:  Past Medical History  Diagnosis Date  . Polycystic ovarian syndrome   . Anemia   . Insomnia   . Restless leg syndrome   . Constipation 05/14/2014    SURGICAL HISTORY: Past Surgical History  Procedure Laterality Date   . Gastric bypass      SOCIAL HISTORY: History   Social History  . Marital Status: Single    Spouse Name: N/A    Number of Children: N/A  . Years of Education: N/A   Occupational History  . Not on file.   Social History Main Topics  . Smoking status: Never Smoker   . Smokeless tobacco: Never Used  . Alcohol Use: Yes     Comment: occasionally  . Drug Use: No  . Sexual Activity: Yes    Birth Control/ Protection: None   Other Topics Concern  . Not on file   Social History Narrative  . No narrative on file    FAMILY HISTORY: Family History  Problem Relation Age of Onset  . Heart disease Mother   . Hyperlipidemia Mother   . Hypertension Mother   . Hyperlipidemia Father   . Hypertension Father   . Cancer Maternal Uncle     pancreatic ca  . Drug abuse Maternal Grandmother   . Drug abuse Paternal Grandmother   . Stroke Paternal Grandfather     ALLERGIES:  has No Known Allergies.  MEDICATIONS:  No current facility-administered medications for this visit.   Current Outpatient Prescriptions  Medication Sig Dispense Refill  . polyethylene glycol (MIRALAX) packet Take 17 g by mouth daily.  14 each  8   Facility-Administered Medications Ordered in Other Visits  Medication Dose Route Frequency Provider Last Rate Last Dose  . 0.9 %  sodium chloride infusion  250 mL Intravenous Once Heath Lark, MD      . heparin lock flush 100  unit/mL  500 Units Intracatheter Daily PRN Heath Lark, MD      . heparin lock flush 100 unit/mL  250 Units Intracatheter PRN Heath Lark, MD      . sodium chloride 0.9 % injection 10 mL  10 mL Intracatheter PRN Heath Lark, MD      . sodium chloride 0.9 % injection 3 mL  3 mL Intracatheter PRN Heath Lark, MD        REVIEW OF SYSTEMS:   Constitutional: Denies fevers, chills or abnormal night sweats Eyes: Denies blurriness of vision, double vision or watery eyes Ears, nose, mouth, throat, and face: Denies mucositis or sore throat Respiratory:  Denies cough, dyspnea or wheezes Cardiovascular: Denies palpitation, chest discomfort or lower extremity swelling Skin: Denies abnormal skin rashes Lymphatics: Denies new lymphadenopathy or easy bruising Neurological:Denies numbness, tingling or new weaknesses Behavioral/Psych: Mood is stable, no new changes  All other systems were reviewed with the patient and are negative.  PHYSICAL EXAMINATION: ECOG PERFORMANCE STATUS: 1 - Symptomatic but completely ambulatory  Filed Vitals:   05/14/14 1118  BP: 120/61  Pulse: 75  Temp: 98.8 F (37.1 C)  Resp: 18   Filed Weights   05/14/14 1118  Weight: 154 lb (69.854 kg)    GENERAL:alert, no distress and comfortable SKIN: skin color, texture, turgor are normal, no rashes or significant lesions. Loose skin is noted EYES: normal, conjunctiva are pale and non-injected, sclera clear OROPHARYNX:no exudate, no erythema and lips, buccal mucosa, and tongue normal  NECK: supple, thyroid normal size, non-tender, without nodularity LYMPH:  no palpable lymphadenopathy in the cervical, axillary or inguinal LUNGS: clear to auscultation and percussion with normal breathing effort HEART: regular rate & rhythm and no murmurs and no lower extremity edema ABDOMEN:abdomen soft, non-tender and normal bowel sounds Musculoskeletal:no cyanosis of digits and no clubbing  PSYCH: alert & oriented x 3 with fluent speech NEURO: no focal motor/sensory deficits  LABORATORY DATA:  I have reviewed the data as listed Recent Results (from the past 2160 hour(s))  CYTOLOGY - PAP     Status: None   Collection Time    04/17/14 12:00 AM      Result Value Ref Range   CYTOLOGY - PAP PAP RESULT    WET PREP, GENITAL     Status: None   Collection Time    04/17/14 10:30 AM      Result Value Ref Range   Yeast Wet Prep HPF POC NONE SEEN  NONE SEEN   Trich, Wet Prep NONE SEEN  NONE SEEN   Clue Cells Wet Prep HPF POC NONE SEEN  NONE SEEN   WBC, Wet Prep HPF POC FEW  NONE SEEN   CBC WITH DIFFERENTIAL     Status: Abnormal   Collection Time    05/12/14  4:01 PM      Result Value Ref Range   WBC 4.3  4.0 - 10.5 K/uL   RBC 3.46 (*) 3.87 - 5.11 MIL/uL   Hemoglobin 6.4 (*) 12.0 - 15.0 g/dL   Comment: Result repeated and verified.   HCT 22.6 (*) 36.0 - 46.0 %   MCV 65.3 (*) 78.0 - 100.0 fL   MCH 18.5 (*) 26.0 - 34.0 pg   MCHC 28.3 (*) 30.0 - 36.0 g/dL   RDW 25.6 (*) 11.5 - 15.5 %   Platelets 73 (*) 150 - 400 K/uL   Comment: Platelet count confirmed on smear   Neutrophils Relative % 28 (*) 43 - 77 %  Neutro Abs 1.2 (*) 1.7 - 7.7 K/uL   Lymphocytes Relative 66 (*) 12 - 46 %   Lymphs Abs 2.8  0.7 - 4.0 K/uL   Monocytes Relative 4  3 - 12 %   Monocytes Absolute 0.2  0.1 - 1.0 K/uL   Eosinophils Relative 1  0 - 5 %   Eosinophils Absolute 0.0  0.0 - 0.7 K/uL   Basophils Relative 1  0 - 1 %   Basophils Absolute 0.0  0.0 - 0.1 K/uL   Smear Review SEE NOTE     Comment: WBC are unremarkable  COMPLETE METABOLIC PANEL WITH GFR     Status: None   Collection Time    05/12/14  4:01 PM      Result Value Ref Range   Sodium 136  135 - 145 mEq/L   Potassium 4.5  3.5 - 5.3 mEq/L   Chloride 102  96 - 112 mEq/L   CO2 27  19 - 32 mEq/L   Glucose, Bld 82  70 - 99 mg/dL   BUN 6  6 - 23 mg/dL   Creat 0.61  0.50 - 1.10 mg/dL   Total Bilirubin 0.7  0.2 - 1.2 mg/dL   Alkaline Phosphatase 59  39 - 117 U/L   AST 27  0 - 37 U/L   ALT 19  0 - 35 U/L   Total Protein 7.2  6.0 - 8.3 g/dL   Albumin 4.3  3.5 - 5.2 g/dL   Calcium 8.7  8.4 - 10.5 mg/dL   GFR, Est African American >89     GFR, Est Non African American >89     Comment:       The estimated GFR is a calculation valid for adults (>=8 years old)     that uses the CKD-EPI algorithm to adjust for age and sex. It is       not to be used for children, pregnant women, hospitalized patients,        patients on dialysis, or with rapidly changing kidney function.     According to the NKDEP, eGFR >89 is normal, 60-89 shows mild      impairment, 30-59 shows moderate impairment, 15-29 shows severe     impairment and <15 is ESRD.        TSH     Status: None   Collection Time    05/12/14  4:01 PM      Result Value Ref Range   TSH 1.185  0.350 - 4.500 uIU/mL  LIPID PANEL     Status: None   Collection Time    05/12/14  4:01 PM      Result Value Ref Range   Cholesterol 179  0 - 200 mg/dL   Comment: ATP III Classification:           < 200        mg/dL        Desirable          200 - 239     mg/dL        Borderline High          >= 240        mg/dL        High         Triglycerides 67  <150 mg/dL   HDL 68  >39 mg/dL   Total CHOL/HDL Ratio 2.6     VLDL 13  0 - 40 mg/dL   LDL Cholesterol 98  0 - 99 mg/dL   Comment:       Total Cholesterol/HDL Ratio:CHD Risk                            Coronary Heart Disease Risk Table                                            Men       Women              1/2 Average Risk              3.4        3.3                  Average Risk              5.0        4.4               2X Average Risk              9.6        7.1               3X Average Risk             23.4       11.0     Use the calculated Patient Ratio above and the CHD Risk table      to determine the patient's CHD Risk.     ATP III Classification (LDL):           < 100        mg/dL         Optimal          100 - 129     mg/dL         Near or Above Optimal          130 - 159     mg/dL         Borderline High          160 - 189     mg/dL         High           > 190        mg/dL         Very High        VITAMIN D 25 HYDROXY     Status: Abnormal   Collection Time    05/12/14  4:01 PM      Result Value Ref Range   Vit D, 25-Hydroxy <10 (*) 30 - 89 ng/mL   Comment: Result repeated and verified.     This assay accurately quantifies Vitamin D, which is the sum of the     25-Hydroxy forms of Vitamin D2 and D3.  Studies have shown that the     optimum concentration of 25-Hydroxy Vitamin D is 30 ng/mL or higher.       Concentrations of Vitamin D between 20 and 29 ng/mL are considered to     be insufficient and concentrations less than 20 ng/mL are considered     to be deficient for Vitamin D.  CBC & DIFF AND RETIC     Status: Abnormal   Collection Time    05/14/14 12:14 PM  Result Value Ref Range   WBC 3.5 (*) 3.9 - 10.3 10e3/uL   NEUT# 1.5  1.5 - 6.5 10e3/uL   HGB 6.4 (*) 11.6 - 15.9 g/dL   HCT 23.7 (*) 34.8 - 46.6 %   Platelets 45 (*) 145 - 400 10e3/uL   MCV 69.7 (*) 79.5 - 101.0 fL   MCH 18.8 (*) 25.1 - 34.0 pg   MCHC 27.0 (*) 31.5 - 36.0 g/dL   RBC 3.40 (*) 3.70 - 5.45 10e6/uL   RDW 26.0 (*) 11.2 - 14.5 %   lymph# 1.8  0.9 - 3.3 10e3/uL   MONO# 0.2  0.1 - 0.9 10e3/uL   Eosinophils Absolute 0.0  0.0 - 0.5 10e3/uL   Basophils Absolute 0.0  0.0 - 0.1 10e3/uL   NEUT% 41.6  38.4 - 76.8 %   LYMPH% 52.3 (*) 14.0 - 49.7 %   MONO% 4.9  0.0 - 14.0 %   EOS% 0.9  0.0 - 7.0 %   BASO% 0.3  0.0 - 2.0 %   Retic % 0.72  0.70 - 2.10 %   Retic Ct Abs 24.48 (*) 33.70 - 90.70 10e3/uL   Immature Retic Fract 24.80 (*) 1.60 - 10.00 %  [facility] SMEAR     Status: None   Collection Time    05/14/14 12:14 PM      Result Value Ref Range   Smear Result Smear Available     ASSESSMENT & PLAN:  Iron deficiency anemia, unspecified We discussed some of the risks, benefits, and alternatives of blood transfusions. The patient is symptomatic from anemia and the hemoglobin level is critically low.  Some of the side-effects to be expected including risks of transfusion reactions, chills, infection, syndrome of volume overload and risk of hospitalization from various reasons and the patient is willing to proceed and went ahead to sign consent today. I will proceed with one unit of blood transfusion. The most likely cause of her anemia is due to chronic blood loss/malabsorption syndrome and mineral deficiencies. We discussed some of the risks, benefits, and alternatives of intravenous iron infusions. The patient is symptomatic  from anemia and the iron level is critically low. She tolerated oral iron supplement poorly and desires to achieved higher levels of iron faster for adequate hematopoesis. Some of the side-effects to be expected including risks of infusion reactions, phlebitis, headaches, nausea and fatigue.  The patient is willing to proceed. Patient education material was dispensed.  Goal is to keep ferritin level greater than 50   Gastric bypass status for obesity She has multiple mineral deficiencies. In addition to vitamin B12 and iron replacement, I recommend high-dose multivitamin supplements.  B12 DEFICIENCY I will proceed with vitamin B12 injection today and tomorrow and I recommend she takes high-dose oral vitamin B12 as well.  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 I recommend high-dose vitamin D replacement with 5000 units daily.  Constipation I recommend MiraLAX daily.    All questions were answered. The patient knows to call the clinic with any problems, questions or concerns. I spent 40 minutes counseling the patient face to face. The total time spent in the appointment was 60 minutes and more than 50% was on counseling.     [place], [place], MD 05/14/2014 1:05 PM

## 2014-05-14 NOTE — Assessment & Plan Note (Signed)
I will proceed with vitamin B12 injection today and tomorrow and I recommend she takes high-dose oral vitamin B12 as well.

## 2014-05-14 NOTE — Assessment & Plan Note (Signed)
This is related to severe malabsorption. I recommend high-dose vitamin D replacement with 5000 units daily.

## 2014-05-14 NOTE — Procedures (Signed)
[facility]  Procedure Note  MARLIE KUENNEN LFY:[id number] DOB: 07/14/1981 DOA: 05/14/2014   PCP: Lorayne Marek, MD   Associated Diagnosis: Anemia of other chronic disease  Procedure Note: IV infusion of 1 unit PRBCs; iron infusion, and B12 injection   Condition During Procedure:  Pt tolerated well; pt observed for 30 minutes post iron infusion   Condition at Discharge: No complications noted   Nigel Sloop, [facility]

## 2014-05-14 NOTE — Assessment & Plan Note (Signed)
I recommend MiraLAX daily.

## 2014-05-15 ENCOUNTER — Observation Stay (HOSPITAL_COMMUNITY)
Admission: EM | Admit: 2014-05-15 | Discharge: 2014-05-16 | Disposition: A | Discharge status: Home / Self Care | Payer: Medicaid Other | Attending: Internal Medicine | Admitting: Internal Medicine

## 2014-05-15 ENCOUNTER — Ambulatory Visit: Payer: Medicaid Other

## 2014-05-15 ENCOUNTER — Encounter (HOSPITAL_COMMUNITY): Payer: Self-pay | Admitting: Emergency Medicine

## 2014-05-15 ENCOUNTER — Non-Acute Institutional Stay (HOSPITAL_COMMUNITY)
Admission: AD | Admit: 2014-05-15 | Discharge: 2014-05-15 | Disposition: A | Discharge status: Home / Self Care | Payer: Medicaid Other | Source: Ambulatory Visit | Attending: Hematology and Oncology | Admitting: Hematology and Oncology

## 2014-05-15 ENCOUNTER — Telehealth: Payer: Self-pay | Admitting: *Deleted

## 2014-05-15 ENCOUNTER — Other Ambulatory Visit: Payer: Self-pay | Admitting: Hematology and Oncology

## 2014-05-15 ENCOUNTER — Other Ambulatory Visit (HOSPITAL_BASED_OUTPATIENT_CLINIC_OR_DEPARTMENT_OTHER): Payer: Medicaid Other

## 2014-05-15 ENCOUNTER — Ambulatory Visit (HOSPITAL_BASED_OUTPATIENT_CLINIC_OR_DEPARTMENT_OTHER): Payer: Medicaid Other | Admitting: Hematology and Oncology

## 2014-05-15 ENCOUNTER — Emergency Department (HOSPITAL_COMMUNITY): Payer: Medicaid Other

## 2014-05-15 VITALS — BP 109/56 | HR 83 | Temp 98.2°F | Resp 18 | Ht 65.5 in | Wt 156.0 lb

## 2014-05-15 DIAGNOSIS — E860 Dehydration: Secondary | ICD-10-CM | POA: Diagnosis not present

## 2014-05-15 DIAGNOSIS — Z9884 Bariatric surgery status: Secondary | ICD-10-CM

## 2014-05-15 DIAGNOSIS — R0789 Other chest pain: Secondary | ICD-10-CM

## 2014-05-15 DIAGNOSIS — R079 Chest pain, unspecified: Secondary | ICD-10-CM | POA: Diagnosis not present

## 2014-05-15 DIAGNOSIS — K5900 Constipation, unspecified: Secondary | ICD-10-CM | POA: Insufficient documentation

## 2014-05-15 DIAGNOSIS — M545 Low back pain, unspecified: Secondary | ICD-10-CM

## 2014-05-15 DIAGNOSIS — G2581 Restless legs syndrome: Secondary | ICD-10-CM | POA: Insufficient documentation

## 2014-05-15 DIAGNOSIS — E559 Vitamin D deficiency, unspecified: Secondary | ICD-10-CM | POA: Insufficient documentation

## 2014-05-15 DIAGNOSIS — Z79899 Other long term (current) drug therapy: Secondary | ICD-10-CM | POA: Insufficient documentation

## 2014-05-15 DIAGNOSIS — D509 Iron deficiency anemia, unspecified: Secondary | ICD-10-CM

## 2014-05-15 DIAGNOSIS — G4700 Insomnia, unspecified: Secondary | ICD-10-CM | POA: Diagnosis present

## 2014-05-15 DIAGNOSIS — R51 Headache: Secondary | ICD-10-CM

## 2014-05-15 DIAGNOSIS — E538 Deficiency of other specified B group vitamins: Secondary | ICD-10-CM | POA: Insufficient documentation

## 2014-05-15 DIAGNOSIS — D638 Anemia in other chronic diseases classified elsewhere: Secondary | ICD-10-CM

## 2014-05-15 DIAGNOSIS — M549 Dorsalgia, unspecified: Secondary | ICD-10-CM | POA: Insufficient documentation

## 2014-05-15 DIAGNOSIS — G44209 Tension-type headache, unspecified, not intractable: Secondary | ICD-10-CM

## 2014-05-15 DIAGNOSIS — D696 Thrombocytopenia, unspecified: Secondary | ICD-10-CM | POA: Diagnosis not present

## 2014-05-15 DIAGNOSIS — R519 Headache, unspecified: Secondary | ICD-10-CM | POA: Insufficient documentation

## 2014-05-15 LAB — CBC WITH DIFFERENTIAL/PLATELET
BASO%: 0.2 % (ref 0.0–2.0)
BASOS PCT: 0 % (ref 0–1)
Basophils Absolute: 0 10*3/uL (ref 0.0–0.1)
Basophils Absolute: 0 10*3/uL (ref 0.0–0.1)
EOS PCT: 1 % (ref 0–5)
EOS%: 0.9 % (ref 0.0–7.0)
Eosinophils Absolute: 0.1 10*3/uL (ref 0.0–0.5)
Eosinophils Absolute: 0.1 10*3/uL (ref 0.0–0.7)
HEMATOCRIT: 26 % — AB (ref 34.8–46.6)
HEMATOCRIT: 23.7 % — AB (ref 36.0–46.0)
HGB: 7.3 g/dL — ABNORMAL LOW (ref 11.6–15.9)
Hemoglobin: 6.7 g/dL — CL (ref 12.0–15.0)
LYMPH#: 1.4 10*3/uL (ref 0.9–3.3)
LYMPH%: 24.6 % (ref 14.0–49.7)
Lymphocytes Relative: 32 % (ref 12–46)
Lymphs Abs: 1.9 10*3/uL (ref 0.7–4.0)
MCH: 20.2 pg — AB (ref 25.1–34.0)
MCH: 20.4 pg — ABNORMAL LOW (ref 26.0–34.0)
MCHC: 28.1 g/dL — AB (ref 31.5–36.0)
MCHC: 28.3 g/dL — ABNORMAL LOW (ref 30.0–36.0)
MCV: 71.8 fL — ABNORMAL LOW (ref 79.5–101.0)
MCV: 72.3 fL — ABNORMAL LOW (ref 78.0–100.0)
MONO#: 0.2 10*3/uL (ref 0.1–0.9)
MONO%: 3.8 % (ref 0.0–14.0)
MONOS PCT: 7 % (ref 3–12)
Monocytes Absolute: 0.4 10*3/uL (ref 0.1–1.0)
NEUT#: 3.9 10*3/uL (ref 1.5–6.5)
NEUT%: 70.5 % (ref 38.4–76.8)
NEUTROS ABS: 3.6 10*3/uL (ref 1.7–7.7)
Neutrophils Relative %: 60 % (ref 43–77)
Platelets: 76 10*3/uL — ABNORMAL LOW (ref 145–400)
Platelets: 71 10*3/uL — ABNORMAL LOW (ref 150–400)
RBC: 3.62 10*6/uL — AB (ref 3.70–5.45)
RBC: 3.28 MIL/uL — ABNORMAL LOW (ref 3.87–5.11)
RDW: 26.8 % — AB (ref 11.2–14.5)
RDW: 26.6 % — ABNORMAL HIGH (ref 11.5–15.5)
WBC: 5.6 10*3/uL (ref 3.9–10.3)
WBC: 6 10*3/uL (ref 4.0–10.5)
nRBC: 1 % — ABNORMAL HIGH (ref 0–0)

## 2014-05-15 LAB — URINALYSIS, MICROSCOPIC - CHCC
BILIRUBIN (URINE): NEGATIVE
Blood: NEGATIVE
Glucose: NEGATIVE mg/dL
Ketones: NEGATIVE mg/dL
LEUKOCYTE ESTERASE: NEGATIVE
NITRITE: NEGATIVE
PH: 6.5 (ref 4.6–8.0)
Protein: NEGATIVE mg/dL
Specific Gravity, Urine: 1.015 (ref 1.003–1.035)
Urobilinogen, UR: 0.2 mg/dL (ref 0.2–1)

## 2014-05-15 LAB — TYPE AND SCREEN
ABO/RH(D): O POS
Antibody Screen: NEGATIVE
UNIT DIVISION: 0

## 2014-05-15 LAB — COMPREHENSIVE METABOLIC PANEL (CC13)
ALBUMIN: 3.9 g/dL (ref 3.5–5.0)
ALT: 31 U/L (ref 0–55)
AST: 38 U/L — ABNORMAL HIGH (ref 5–34)
Alkaline Phosphatase: 67 U/L (ref 40–150)
Anion Gap: 8 mEq/L (ref 3–11)
BUN: 5.8 mg/dL — ABNORMAL LOW (ref 7.0–26.0)
CALCIUM: 9 mg/dL (ref 8.4–10.4)
CHLORIDE: 104 meq/L (ref 98–109)
CO2: 24 meq/L (ref 22–29)
Creatinine: 0.8 mg/dL (ref 0.6–1.1)
Glucose: 178 mg/dl — ABNORMAL HIGH (ref 70–140)
Potassium: 3.9 mEq/L (ref 3.5–5.1)
SODIUM: 136 meq/L (ref 136–145)
TOTAL PROTEIN: 7 g/dL (ref 6.4–8.3)
Total Bilirubin: 1.1 mg/dL (ref 0.20–1.20)

## 2014-05-15 LAB — I-STAT TROPONIN, ED: Troponin i, poc: 0 ng/mL (ref 0.00–0.08)

## 2014-05-15 LAB — BASIC METABOLIC PANEL
Anion gap: 9 (ref 5–15)
BUN: 6 mg/dL (ref 6–23)
CHLORIDE: 105 meq/L (ref 96–112)
CO2: 25 mEq/L (ref 19–32)
CREATININE: 0.59 mg/dL (ref 0.50–1.10)
Calcium: 9 mg/dL (ref 8.4–10.5)
GFR calc Af Amer: >90 mL/min (ref >90)
GFR calc non Af Amer: >90 mL/min (ref >90)
Glucose, Bld: 88 mg/dL (ref 70–99)
Potassium: 4.5 mEq/L (ref 3.7–5.3)
Sodium: 139 mEq/L (ref 137–147)

## 2014-05-15 LAB — LACTATE DEHYDROGENASE (CC13): LDH: 132 U/L (ref 125–245)

## 2014-05-15 LAB — TECHNOLOGIST REVIEW

## 2014-05-15 LAB — FLOW CYTOMETRY

## 2014-05-15 LAB — PREPARE RBC (CROSSMATCH)

## 2014-05-15 MED ORDER — POLYETHYLENE GLYCOL 3350 17 G PO PACK
17.0000 g | PACK | Freq: Every day | ORAL | Status: DC
  Filled 2014-05-15: qty 1

## 2014-05-15 MED ORDER — HYDROMORPHONE HCL PF 2 MG/ML IJ SOLN
1.0000 mg | Freq: Once | INTRAMUSCULAR | Status: AC
  Administered 2014-05-15: 1 mg via INTRAVENOUS
  Filled 2014-05-15: qty 1

## 2014-05-15 MED ORDER — CYANOCOBALAMIN 1000 MCG/ML IJ SOLN
1000.0000 ug | Freq: Once | INTRAMUSCULAR | Status: AC
Start: 2014-05-15 — End: 2014-05-15
  Administered 2014-05-15: 1000 ug via INTRAMUSCULAR
  Filled 2014-05-15: qty 1

## 2014-05-15 MED ORDER — ZOLPIDEM TARTRATE 5 MG PO TABS
5.0000 mg | ORAL_TABLET | Freq: Every evening | ORAL | Status: DC | PRN
  Administered 2014-05-16: 5 mg via ORAL
  Filled 2014-05-15: qty 1

## 2014-05-15 MED ORDER — PROMETHAZINE HCL 25 MG/ML IJ SOLN
25.0000 mg | Freq: Once | INTRAMUSCULAR | Status: AC
  Administered 2014-05-15: 25 mg via INTRAVENOUS
  Filled 2014-05-15: qty 1

## 2014-05-15 MED ORDER — ACETAMINOPHEN 325 MG PO TABS
650.0000 mg | ORAL_TABLET | ORAL | Status: DC | PRN
  Administered 2014-05-16: 650 mg via ORAL
  Filled 2014-05-15: qty 2

## 2014-05-15 MED ORDER — ONDANSETRON HCL 4 MG/2ML IJ SOLN: 4.0000 mg | Freq: Four times a day (QID) | INTRAMUSCULAR | Status: DC | PRN

## 2014-05-15 MED ORDER — SODIUM CHLORIDE 0.9 % IV SOLN
10.0000 mL/h | Freq: Once | INTRAVENOUS | Status: AC
  Administered 2014-05-16: 10 mL/h via INTRAVENOUS

## 2014-05-15 MED ORDER — SODIUM CHLORIDE 0.9 % IV SOLN
INTRAVENOUS | Status: AC
  Administered 2014-05-15: 15:00:00 via INTRAVENOUS

## 2014-05-15 NOTE — Telephone Encounter (Signed)
Pt called. States she has a "massive headache and severe low back pain- feels like someone stomping on my back". Was concerned about taking 2nd B-12 injection today. Had 1 unit RBC, B-12 injection and 1st time feraheme yesterday.

## 2014-05-15 NOTE — Assessment & Plan Note (Signed)
Cause is unknown, could be due to severe anemia or mild reaction from recent transfusion of IV iron. I recommend IV fluids, IV Phenergan and Dilaudid to see if this headache were improved.

## 2014-05-15 NOTE — H&P (Addendum)
[facility] History and Physical  Haley Martinez ZOX:[id number] DOB: 12-17-1980 DOA: 05/15/2014  Referring physician: ER physician PCP: Lorayne Marek, MD   Chief Complaint: chest pain   HPI:  33 year old female with past medical history of gastric bypass surgery, related vitamin B 12 deficiency, history of iron deficiency anemia who presented to [facility] ED with sudden onset sharp, left sided chest pain, non-radiating, started at rest after pt took aspirin for headache. Pain spontaneously resolved and now in ED she is chest pain free. No associated shortness of breath or palpitations. No abdominal pain. Some nausea but no vomiting. No fevers or chills. No lightheadedness or loss of consciousness's. In ED, BP was 107/64, HR 66, T max 98.3 F and oxygen saturation 100% on room air. Blood work revealed hemoglobin of 6.7. She was seen in clinic by Dr. Alvy Bimler and has received IV iron 8/20 and blood transfusion 8/21. Order was placed for another 1 unit PRBC in ED on admission. THe first troponin level was WNL. The 12 lead EKG showed sinus rhythm.  Assessment & Plan    Principal Problem:   Chest pain  Chest pain resolved at this time.  Heart score 0. The first troponin level was WNL. Cycle cardiac enzymes.  The 12 lead EKG showed sinus rhythm. Active Problems:   Iron deficiency anemia  Hemoglobin is  6.7 on this admission. Order placed for 1 unit of PRBC transfusion.  Pt had IV iron adn unit of blood given in clinic 05/14/2014 along with B 12 injection.  Check post transfusion CBC.   Thrombocytopenia  Platelet count 76 on this admission. Unclear etiology.  Will continue to monitor CBC. Use SCD;s for DVT prophylaxis.  Please inform Dr. Alvy Bimler of patient's admission   DVT prophylaxis  SCD's bilaterally due to anemia and thombocytopenia and risk of bleeding   Radiological Exams on Admission: Dg Chest 2 View  05/15/2014   No edema or consolidation.     Code Status: Full Family  Communication: Plan of care discussed with the patient  Disposition Plan: Admit for further evaluation  Leisa Lenz, MD  [facility] Pager [phone]  Review of Systems:  Constitutional: Negative for fever, chills and malaise/fatigue. Negative for diaphoresis.  HENT: Negative for hearing loss, ear pain, nosebleeds, congestion, sore throat, neck pain, tinnitus and ear discharge.   Eyes: Negative for blurred vision, double vision, photophobia, pain, discharge and redness.  Respiratory: Negative for cough, hemoptysis, sputum production, shortness of breath, wheezing and stridor.   Cardiovascular: positive for chest pain, no palpitations, orthopnea, claudication and leg swelling.  Gastrointestinal: Negative for vomiting and abdominal pain. Negative for heartburn, constipation, blood in stool and melena.  Genitourinary: Negative for dysuria, urgency, frequency, hematuria and flank pain.  Musculoskeletal: Negative for myalgias, back pain, joint pain and falls.  Skin: Negative for itching and rash.  Neurological: positive for headache. No weakness, no tremors. Endo/Heme/Allergies: Negative for environmental allergies and polydipsia. Does not bruise/bleed easily.  Psychiatric/Behavioral: Negative for suicidal ideas. The patient is not nervous/anxious.      Past Medical History  Diagnosis Date  . Polycystic ovarian syndrome   . Anemia   . Insomnia   . Restless leg syndrome   . Constipation 05/14/2014   Past Surgical History  Procedure Laterality Date  . Gastric bypass     Social History:  reports that she has never smoked. She has never used smokeless tobacco. She reports that she drinks alcohol. She reports that she does not use illicit  drugs.  Allergies  Allergen Reactions  . Trazodone And Nefazodone     "severe headache"    Family History:  Family History  Problem Relation Age of Onset  . Heart disease Mother   . Hyperlipidemia Mother   . Hypertension Mother   .  Hyperlipidemia Father   . Hypertension Father   . Cancer Maternal Uncle     pancreatic ca  . Drug abuse Maternal Grandmother   . Drug abuse Paternal Grandmother   . Stroke Paternal Grandfather      Prior to Admission medications   Medication Sig Start Date End Date Taking? Authorizing Provider  ibuprofen (ADVIL,MOTRIN) 200 MG tablet Take 400 mg by mouth every 8 (eight) hours as needed. For pain.   Yes Historical Provider, MD  PRESCRIPTION MEDICATION Inject into the vein once. Patient stated that she received IV iron, vitamin b12 and a blood [facility] on [facility]14 and IV nausea and pain meds from there today (05/15/14)   Yes Historical Provider, MD  traZODone (DESYREL) 50 MG tablet Take 100 mg by mouth at bedtime as needed for sleep.   Yes Historical Provider, MD  polyethylene glycol (MIRALAX) packet Take 17 g by mouth daily. 05/14/14   Heath Lark, MD   Physical Exam: Filed Vitals:   05/15/14 2032 05/15/14 2215  BP: 114/65 114/73  Pulse: 83 64  Temp: 98.3 F (36.8 C)   TempSrc: Oral   Resp: 18 15  SpO2: 99% 100%    Physical Exam  Constitutional: Appears well-developed and well-nourished. No distress.  HENT: Normocephalic. No tonsillar erythema or exudates Eyes: Conjunctivae and EOM are normal. PERRLA, no scleral icterus.  Neck: Normal ROM. Neck supple. No JVD. No tracheal deviation. No thyromegaly.  CVS: RRR, S1/S2 +, no murmurs, no gallops, no carotid bruit.  Pulmonary: Effort and breath sounds normal, no stridor, rhonchi, wheezes, rales.  Abdominal: Soft. BS +,  no distension, tenderness, rebound or guarding.  Musculoskeletal: Normal range of motion. No edema and no tenderness.  Lymphadenopathy: No lymphadenopathy noted, cervical, inguinal. Neuro: Alert. Normal reflexes, muscle tone coordination. No focal neurologic deficits. Skin: Skin is warm and dry. No rash noted. Not diaphoretic. No erythema. No pallor.  Psychiatric: Normal mood and affect.  Behavior, judgment, thought content normal.   Labs on Admission:  Basic Metabolic Panel:  Recent Labs Lab 05/12/14 1601 05/15/14 1310 05/15/14 2131  NA 136 136 139  K 4.5 3.9 4.5  CL 102  --  105  CO2 27 24 25   GLUCOSE 82 178* 88  BUN 6 5.8* 6  CREATININE 0.61 0.8 0.59  CALCIUM 8.7 9.0 9.0   Liver Function Tests:  Recent Labs Lab 05/12/14 1601 05/15/14 1310  AST 27 38*  ALT 19 31  ALKPHOS 59 67  BILITOT 0.7 1.10  PROT 7.2 7.0  ALBUMIN 4.3 3.9   No results found for this basename: LIPASE, AMYLASE,  in the last 168 hours No results found for this basename: AMMONIA,  in the last 168 hours CBC:  Recent Labs Lab 05/14/14 1214 05/15/14 1310 05/15/14 2131  WBC 3.5* 5.6 6.0  NEUTROABS 1.5 3.9 3.6  HGB 6.4* 7.3* 6.7*  HCT 23.7* 26.0* 23.7*  MCV 69.7* 71.8* 72.3*  PLT 45* 76* 71*   Cardiac Enzymes: No results found for this basename: CKTOTAL, CKMB, CKMBINDEX, TROPONINI,  in the last 168 hours BNP: No components found with this basename: POCBNP,  CBG: No results found for this basename: GLUCAP,  in the  last 168 hours  If 7PM-7AM, please contact night-coverage [email or web address] Password [email or web address] 05/15/2014, 11:01 PM

## 2014-05-15 NOTE — H&P (Signed)
She is here for IV fluids, Vitamin B 12 injection, Pain medication and IV phenergan

## 2014-05-15 NOTE — Assessment & Plan Note (Signed)
She received iv iron yesterday. Continue supportive care

## 2014-05-15 NOTE — ED Notes (Signed)
C/o sharp chest pain under L breast that started 45 min PTA. Associated nausea. Denies associated shob, dizziness, rash, fever. C/o HA since yesterday. Pt reports Hx anemia, had blood transfusion yesterday. Received fluids, B12 injection and pain and nausea meds today.

## 2014-05-15 NOTE — Progress Notes (Signed)
Pt scheduled for vit b12 inj today, pt received inj in MD visit.

## 2014-05-15 NOTE — Assessment & Plan Note (Signed)
I will proceed with vitamin B12 injection today and I recommend she takes high-dose oral vitamin B12 as well.

## 2014-05-15 NOTE — Progress Notes (Signed)
[facility] OFFICE PROGRESS NOTE  ADVANI, [name], MD SUMMARY OF HEMATOLOGIC HISTORY: She was found to have abnormal CBC from recent blood work from her primary care physician's office. The total white blood cell count was 4.3, hemoglobin 6.4, MCV of 65.3 and platelet count of 73,000. She also has severe vitamin D deficiency, less than 10. She denies chest pain but she has severe shortness of breath on minimal exertion, pre-syncopal episodes, palpitations leg cramps and restless leg syndrome. She has history of gastric bypass surgery 9 what years ago and has lost 110 pounds of weight. She new of prior history of mineral deficiency is but were not taking any form of vitamin replacement therapy consistently. She never donated blood. She had received blood transfusion in the past during her gastric bypass surgery. The patient was prescribed oral iron supplements but she is not taking it due to severe diarrhea with oral iron supplement The patient also complained of insomnia. She is constipated for one week. She received one unit of blood and IV iron along with vitamin B12 injection on 05/14/2014 INTERVAL HISTORY: GENELDA ROARK 33 y.o. female returns for urgent followup due to severe headache and lower back pain. These symptoms started earlier this morning and the patient was unable to sleep. She is rating her pain at 10 out of 10 pain. She denies any nausea or vomiting. She has very poor oral intake since this happens.  I have reviewed the past medical history, past surgical history, social history and family history with the patient and they are unchanged from previous note.  ALLERGIES:  has No Known Allergies.  MEDICATIONS:  No current facility-administered medications for this visit.   No current outpatient prescriptions on file.   Facility-Administered Medications Ordered in Other Visits  Medication Dose Route Frequency Provider Last Rate Last Dose  . 0.9 %  sodium chloride  infusion   Intravenous Continuous Heath Lark, MD      . cyanocobalamin ((VITAMIN B-12)) injection 1,000 mcg  1,000 mcg Intramuscular Once Heath Lark, MD      . HYDROmorphone (DILAUDID) injection 1 mg  1 mg Intravenous Once Heath Lark, MD      . promethazine (PHENERGAN) injection 25 mg  25 mg Intravenous Once Heath Lark, MD         REVIEW OF SYSTEMS:   Constitutional: Denies fevers, chills or night sweats Eyes: Denies blurriness of vision Ears, nose, mouth, throat, and face: Denies mucositis or sore throat Respiratory: Denies cough, dyspnea or wheezes Cardiovascular: Denies palpitation, chest discomfort or lower extremity swelling Gastrointestinal:  Denies nausea, heartburn or change in bowel habits Skin: Denies abnormal skin rashes Lymphatics: Denies new lymphadenopathy or easy bruising Neurological:Denies numbness, tingling or new weaknesses Behavioral/Psych: Mood is stable, no new changes  All other systems were reviewed with the patient and are negative.  PHYSICAL EXAMINATION: ECOG PERFORMANCE STATUS: 1 - Symptomatic but completely ambulatory  Filed Vitals:   05/15/14 1327  BP: 109/56  Pulse: 83  Temp: 98.2 F (36.8 C)  Resp: 18   Filed Weights   05/15/14 1327  Weight: 156 lb (70.761 kg)    GENERAL:alert, but she looked ill appearing SKIN: skin color, texture, turgor are normal, no rashes or significant lesions EYES: normal, Conjunctiva are pale and non-injected, sclera clear OROPHARYNX:no exudate, no erythema and lips, buccal mucosa, and tongue normal . She has dry mouth NECK: supple, thyroid normal size, non-tender, without nodularity LYMPH:  no palpable lymphadenopathy in the cervical, axillary or inguinal  LUNGS: clear to auscultation and percussion with normal breathing effort HEART: regular rate & rhythm and no murmurs and no lower extremity edema ABDOMEN:abdomen soft, non-tender and normal bowel sounds Musculoskeletal:no cyanosis of digits and no clubbing  NEURO:  alert & oriented x 3 with fluent speech, no focal motor/sensory deficits  LABORATORY DATA:  I have reviewed the data as listed Results for orders placed in visit on 05/15/14 (from the past 48 hour(s))  COMPREHENSIVE METABOLIC PANEL (NU27)     Status: Abnormal   Collection Time    05/15/14  1:10 PM      Result Value Ref Range   Sodium 136  136 - 145 mEq/L   Potassium 3.9  3.5 - 5.1 mEq/L   Chloride 104  98 - 109 mEq/L   CO2 24  22 - 29 mEq/L   Glucose 178 (*) 70 - 140 mg/dl   BUN 5.8 (*) 7.0 - 26.0 mg/dL   Creatinine 0.8  0.6 - 1.1 mg/dL   Total Bilirubin 1.10  0.20 - 1.20 mg/dL   Alkaline Phosphatase 67  40 - 150 U/L   AST 38 (*) 5 - 34 U/L   ALT 31  0 - 55 U/L   Total Protein 7.0  6.4 - 8.3 g/dL   Albumin 3.9  3.5 - 5.0 g/dL   Calcium 9.0  8.4 - 10.4 mg/dL   Anion Gap 8  3 - 11 mEq/L  LACTATE DEHYDROGENASE (CC13)     Status: None   Collection Time    05/15/14  1:10 PM      Result Value Ref Range   LDH 132  125 - 245 U/L  CBC WITH DIFFERENTIAL     Status: Abnormal   Collection Time    05/15/14  1:10 PM      Result Value Ref Range   WBC 5.6  3.9 - 10.3 10e3/uL   NEUT# 3.9  1.5 - 6.5 10e3/uL   HGB 7.3 (*) 11.6 - 15.9 g/dL   HCT 26.0 (*) 34.8 - 46.6 %   Platelets 76 (*) 145 - 400 10e3/uL   MCV 71.8 (*) 79.5 - 101.0 fL   MCH 20.2 (*) 25.1 - 34.0 pg   MCHC 28.1 (*) 31.5 - 36.0 g/dL   RBC 3.62 (*) 3.70 - 5.45 10e6/uL   RDW 26.8 (*) 11.2 - 14.5 %   lymph# 1.4  0.9 - 3.3 10e3/uL   MONO# 0.2  0.1 - 0.9 10e3/uL   Eosinophils Absolute 0.1  0.0 - 0.5 10e3/uL   Basophils Absolute 0.0  0.0 - 0.1 10e3/uL   NEUT% 70.5  38.4 - 76.8 %   LYMPH% 24.6  14.0 - 49.7 %   MONO% 3.8  0.0 - 14.0 %   EOS% 0.9  0.0 - 7.0 %   BASO% 0.2  0.0 - 2.0 %   nRBC 1 (*) 0 - 0 %  TECHNOLOGIST REVIEW     Status: None   Collection Time    05/15/14  1:10 PM      Result Value Ref Range   Technologist Review       Value: Slight polychromasia, Moderate teardrops, ovalos and target cells, few fragmented  RBCs  URINALYSIS, MICROSCOPIC - [facility]     Status: None   Collection Time    05/15/14  1:11 PM      Result Value Ref Range   Glucose Negative  Negative mg/dL   Comment: Note: New unit of measure   Bilirubin (Urine) Negative  Negative   Ketones Negative  Negative mg/dL   Specific Gravity, Urine 1.015  1.003 - 1.035   Blood Negative  Negative   pH 6.5  4.6 - 8.0   Protein Negative  Negative- <30 mg/dL   Urobilinogen, UR 0.2  0.2 - 1 mg/dL   Nitrite Negative  Negative   Leukocyte Esterase Negative  Negative   RBC / HPF 0-2  0 - 2   WBC, UA 0-2  0 - 2   Bacteria, UA Few  Negative- Trace   Epithelial Cells Occasional  Negative- Few   Mucus, UA Moderate  Negative- Small    Lab Results  Component Value Date   WBC 5.6 05/15/2014   HGB 7.3* 05/15/2014   HCT 26.0* 05/15/2014   MCV 71.8* 05/15/2014   PLT 76* 05/15/2014    ASSESSMENT & PLAN:  B12 DEFICIENCY I will proceed with vitamin B12 injection today and I recommend she takes high-dose oral vitamin B12 as well.    Iron deficiency anemia, unspecified She received iv iron yesterday. Continue supportive care  Headache Cause is unknown, could be due to severe anemia or mild reaction from recent transfusion of IV iron. I recommend IV fluids, IV Phenergan and Dilaudid to see if this headache were improved.  Back pain Cause is unknown, could be due to severe anemia or mild reaction from recent transfusion of IV iron. I recommend IV fluids, IV Phenergan and Dilaudid to see if this headache were improved.     All questions were answered. The patient knows to call the clinic with any problems, questions or concerns. No barriers to learning was detected.  I spent 25 minutes counseling the patient face to face. The total time spent in the appointment was 30 minutes and more than 50% was on counseling.     [facility], [place], MD 05/15/2014 2:27 PM

## 2014-05-15 NOTE — ED Provider Notes (Signed)
CSN: [id number]     Arrival date & time 05/15/14  2018 History   First MD Initiated Contact with Patient 05/15/14 2117     Chief Complaint  Patient presents with  . Chest Pain     (Consider location/radiation/quality/duration/timing/severity/associated sxs/prior Treatment) The history is provided by the patient and medical records.   This is a 33 year old female with past medical history significant for insomnia, PCOS., deficiency anemia, B12 deficiency, presenting to the ED for chest pain.  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 Patient currently under the care of Dr. Alvy Bimler for this.  States once returning home she took some motrin which she knows she is not supposed to take due to her bypass, however took it anyway.  States shortly after she developed pain in her left lower chest, underneath her breast and left upper abdomen.  States some palpitations and nausea but denies vomiting.  No SOB, dizziness, fever, chills, sweats.  No prior cardiac hx.  + family hx of CAD (mother and father) and MI (mother).   Patient is not a smoker.  On arrival, pt is asx, VS stable.  Past Medical History  Diagnosis Date  . Polycystic ovarian syndrome   . Anemia   . Insomnia   . Restless leg syndrome   . Constipation 05/14/2014   Past Surgical History  Procedure Laterality Date  . Gastric bypass     Family History  Problem Relation Age of Onset  . Heart disease Mother   . Hyperlipidemia Mother   . Hypertension Mother   . Hyperlipidemia Father   . Hypertension Father   . Cancer Maternal Uncle     pancreatic ca  . Drug abuse Maternal Grandmother   . Drug abuse Paternal Grandmother   . Stroke Paternal Grandfather    History  Substance Use Topics  . Smoking status: Never Smoker   . Smokeless tobacco: Never Used  . Alcohol Use: Yes      Comment: occasionally   OB History   Grav Para Term Preterm Abortions TAB SAB Ect Mult Living   3 1 1  0 2 0 2 0 0 1     Review of Systems  Cardiovascular: Positive for chest pain.  All other systems reviewed and are negative.     Allergies  Trazodone and nefazodone  Home Medications   Prior to Admission medications   Medication Sig Start Date End Date Taking? Authorizing Provider  ibuprofen (ADVIL,MOTRIN) 200 MG tablet Take 400 mg by mouth every 8 (eight) hours as needed. For pain.   Yes Historical Provider, MD  PRESCRIPTION MEDICATION Inject into the vein once. Patient stated that she received IV iron, vitamin b12 and a blood transfusion [facility] on [facility]14 and IV nausea and pain meds from there today (05/15/14)   Yes Historical Provider, MD  traZODone (DESYREL) 50 MG tablet Take 100 mg by mouth at bedtime as needed for sleep.   Yes Historical Provider, MD  polyethylene glycol (MIRALAX) packet Take 17 g by mouth daily. 05/14/14   Ni Gorsuch, MD   BP 114/73  Pulse 64  Temp(Src) 98.3 F (36.8 C) (Oral)  Resp 15  SpO2 100%  LMP 04/14/2014  Physical Exam  Nursing note and vitals reviewed. Constitutional: She is oriented to person, place, and time. She appears well-developed and well-nourished. No distress.  HENT:  Head: Normocephalic and atraumatic.  Mouth/Throat: Oropharynx is clear and moist.  Eyes: Conjunctivae and EOM are normal. Pupils are equal, round, and reactive to light.  Neck: Normal range of motion. Neck supple.  Cardiovascular: Normal rate, regular rhythm and normal heart sounds.   Pulmonary/Chest: Effort normal and breath sounds normal. No respiratory distress. She has no wheezes.  Abdominal: Soft. Bowel sounds are normal. There is no tenderness. There is no guarding.  Musculoskeletal: Normal range of motion.  Neurological: She is alert and oriented to person, place, and time.  Skin: Skin is warm and dry. She is not diaphoretic.   Psychiatric: She has a normal mood and affect.    ED Course  Procedures (including critical care time)  CRITICAL CARE Performed by: Larene Pickett   Total critical care time: 35  Critical care time was exclusive of separately billable procedures and treating other patients.  Critical care was necessary to treat or prevent imminent or life-threatening deterioration.  Critical care was time spent personally by me on the following activities: development of treatment plan with patient and/or surrogate as well as nursing, discussions with consultants, evaluation of patient's response to treatment, examination of patient, obtaining history from patient or surrogate, ordering and performing treatments and interventions, ordering and review of laboratory studies, ordering and review of radiographic studies, pulse oximetry and re-evaluation of patient's condition.  Labs Review Labs Reviewed  CBC WITH DIFFERENTIAL - Abnormal; Notable for the following:    RBC 3.28 (*)    Hemoglobin 6.7 (*)    HCT 23.7 (*)    MCV 72.3 (*)    MCH 20.4 (*)    MCHC 28.3 (*)    RDW 26.6 (*)    Platelets 71 (*)    All other components within normal limits  BASIC METABOLIC PANEL  TROPONIN I  TROPONIN I  TROPONIN I  I-STAT TROPOININ, ED  TYPE AND SCREEN  PREPARE RBC (CROSSMATCH)    Imaging Review Dg Chest 2 View  05/15/2014   CLINICAL DATA:  Chest pain  EXAM: CHEST  2 VIEW  COMPARISON:  August 27, 2007  FINDINGS: There is no edema or consolidation. The heart size and pulmonary vascularity are normal. No pneumothorax. No adenopathy. No bone lesions.  IMPRESSION: No edema or consolidation.   Electronically Signed   By: Lowella Grip M.D.   On: 05/15/2014 22:01     EKG Interpretation None      MDM   Final diagnoses:  Iron deficiency anemia, unspecified  B12 DEFICIENCY    33 year old female with episode of chest pain after taking Motrin. Patient does have history of gastric bypass and does  not usually take NSAIDs. On arrival to the hospital, patient is asymptomatic. On exam, patient in NAD, no murmur noted, lungs CTAB.  EKG sinus rhythm without ischemic changes.   Trop negative.  CXR clear, no widened mediastinum or new murmur to suggest dissection.  Pt is PERC negative.  Chest pain likely due to ingestion of NSAIDs.  Hgb was noted to be low again, currently 6.7 and was 7.3 at time of discharge earlier today.  Patient denies melena, hematochezia, or hematemesis.  Not currently on anti-coagulants.  Patient currently hemodynamically stable.  Will admit for observation and transfuse 1 unit of PRBCs.  Case discussed with Dr. Charlies Silvers who will admit to telemetry.  Temp admission orders placed.  VS remain stable.  Larene Pickett, PA-C 05/16/14 [phone]

## 2014-05-15 NOTE — Procedures (Signed)
[facility]  Procedure Note  EITHEL RYALL BBC:[id number] DOB: 10-31-1980 DOA: 05/15/2014   PCP: Lorayne Marek, MD   Associated Diagnosis: Dehydration (276.51)  Procedure Note: IV infusion of 1 liter NS; B12 injection; pain medications IV push   Condition During Procedure:  Pt tolerated well; no complications noted   Condition at Discharge:  Pt alert, oriented, ambulatory; accompanied by family member   Nigel Sloop, [facility]

## 2014-05-15 NOTE — ED Notes (Signed)
MD at bedside. 

## 2014-05-16 LAB — CBC
HEMATOCRIT: 25 % — AB (ref 36.0–46.0)
Hemoglobin: 7.4 g/dL — ABNORMAL LOW (ref 12.0–15.0)
MCH: 21.5 pg — ABNORMAL LOW (ref 26.0–34.0)
MCHC: 29.6 g/dL — ABNORMAL LOW (ref 30.0–36.0)
MCV: 72.7 fL — AB (ref 78.0–100.0)
Platelets: 84 10*3/uL — ABNORMAL LOW (ref 150–400)
RBC: 3.44 MIL/uL — ABNORMAL LOW (ref 3.87–5.11)
RDW: 26.8 % — AB (ref 11.5–15.5)
WBC: 6.4 10*3/uL (ref 4.0–10.5)

## 2014-05-16 LAB — TROPONIN I

## 2014-05-16 MED ORDER — HYDROCODONE-ACETAMINOPHEN 5-325 MG PO TABS: 1.0000 | ORAL_TABLET | Freq: Four times a day (QID) | ORAL | Status: DC | PRN

## 2014-05-16 NOTE — Discharge Instructions (Signed)

## 2014-05-16 NOTE — Progress Notes (Signed)
UR completed 

## 2014-05-16 NOTE — Progress Notes (Signed)
Pt left hospital at this time with her "friend" at her side. Alert, oriented, and without c/o. Discharge instructions/prescription given/explained with pt verbalizing understanding. Followup appointments noted.

## 2014-05-16 NOTE — Discharge Summary (Signed)
Physician Discharge Summary  Haley Martinez CBJ:[id number] DOB: [birth year] DOA: 05/15/2014  PCP: Lorayne Marek, MD  Admit date: 05/15/2014 Discharge date: 05/16/2014  Recommendations for Outpatient Follow-up:  1. Pt will need to follow up with PCP in 2-3 weeks post discharge 2. Please obtain BMP to evaluate electrolytes and kidney function 3. Please also check CBC to evaluate Hg and Hct levels 4. Pt wants to go home today   Discharge Diagnoses: Chest pain  Principal Problem:   Chest pain Active Problems:   INSOMNIA   Gastric bypass status for obesity   Iron deficiency anemia  Discharge Condition: Stable  Diet recommendation: Heart healthy diet discussed in details   History of present illness:  33 year old female with past medical history of gastric bypass surgery, related vitamin B 12 deficiency, history of iron deficiency anemia who presented to [facility] ED with sudden onset sharp, left sided chest pain, non-radiating, started at rest after pt took aspirin for headache. Pain spontaneously resolved and now in ED she is chest pain free. No associated shortness of breath or palpitations. No abdominal pain. Some nausea but no vomiting. No fevers or chills. No lightheadedness or loss of consciousness's.   In ED, BP was 107/64, HR 66, T max 98.3 F and oxygen saturation 100% on room air. Blood work revealed hemoglobin of 6.7. She was seen in clinic by Dr. Alvy Bimler and has received IV iron 8/20 and blood transfusion 8/21. Order was placed for another 1 unit PRBC in ED on admission. THe first troponin level was WNL. The 12 lead EKG showed sinus rhythm.   Assessment & Plan   Principal Problem:  Chest pain  Chest pain resolved at this time. Heart score 0. The first troponin level was WNL. Cycled cardiac enzymes.  All trop's negative  The 12 lead EKG showed sinus rhythm. Active Problems:  Iron deficiency anemia  Hemoglobin is 6.7 on this admission. Order placed for 1 unit of PRBC transfusion.  Pt  had IV iron and unit of blood given in clinic 05/14/2014 along with B 12 injection.  Post transfusion CBC indicated improved Hg Pt insisting on going home  Thrombocytopenia  Platelet count 76 on this admission. Unclear etiology.  Will continue to monitor CBC. Used SCD;s for DVT prophylaxis.  DVT prophylaxis  SCD's bilaterally due to anemia and thombocytopenia and risk of bleeding   Procedures/Studies: Dg Chest 2 View  05/15/2014 No edema or consolidation.     Consultations:  Oncology   Antibiotics:  None  Discharge Exam: Filed Vitals:   05/16/14 0603  BP: 113/61  Pulse: 64  Temp: 98.1 F (36.7 C)  Resp: 15   Filed Vitals:   05/16/14 0140 05/16/14 0240 05/16/14 0310 05/16/14 0603  BP: 113/65 106/65 106/69 113/61  Pulse: 68 66 61 64  Temp: 97.8 F (36.6 C) 98 F (36.7 C) 97.9 F (36.6 C) 98.1 F (36.7 C)  TempSrc: Oral Oral Oral Oral  Resp: 16 15 16 15   Height:      Weight:      SpO2: 100% 100% 100% 98%    General: Pt is alert, follows commands appropriately, not in acute distress Cardiovascular: Regular rate and rhythm, S1/S2 +, no murmurs, no rubs, no gallops Respiratory: Clear to auscultation bilaterally, no wheezing, no crackles, no rhonchi Abdominal: Soft, non tender, non distended, bowel sounds +, no guarding Extremities: no edema, no cyanosis, pulses palpable bilaterally DP and PT Neuro: Grossly nonfocal  Discharge Instructions  Discharge Instructions   Diet -  low sodium heart healthy    Complete by:  As directed      Increase activity slowly    Complete by:  As directed             Medication List         HYDROcodone-acetaminophen 5-325 MG per tablet  Commonly known as:  NORCO/VICODIN  Take 1 tablet by mouth every 6 (six) hours as needed for moderate pain.     ibuprofen 200 MG tablet  Commonly known as:  ADVIL,MOTRIN  Take 400 mg by mouth every 8 (eight) hours as needed. For pain.     polyethylene glycol packet  Commonly known as:   MIRALAX  Take 17 g by mouth daily.     PRESCRIPTION MEDICATION  Inject into the vein once. Patient stated that she received IV iron, vitamin b12 and a blood [facility] and IV nausea and pain meds from there today (05/15/14)     traZODone 50 MG tablet  Commonly known as:  DESYREL  Take 100 mg by mouth at bedtime as needed for sleep.           Follow-up Information   Follow up with Faye Ramsay, MD. (As needed, If symptoms worsen)    Specialty:  Internal Medicine   Contact information:   [address] [place] [address] [phone]        The results of significant diagnostics from this hospitalization (including imaging, microbiology, ancillary and laboratory) are listed below for reference.     Microbiology: Recent Results (from the past 240 hour(s))  TECHNOLOGIST REVIEW     Status: None   Collection Time    05/15/14  1:10 PM      Result Value Ref Range Status   Technologist Review     Final   Value: Slight polychromasia, Moderate teardrops, ovalos and target cells, few fragmented RBCs     Labs: Basic Metabolic Panel:  Recent Labs Lab 05/12/14 1601 05/15/14 1310 05/15/14 2131  NA 136 136 139  K 4.5 3.9 4.5  CL 102  --  105  CO2 27 24 25   GLUCOSE 82 178* 88  BUN 6 5.8* 6  CREATININE 0.61 0.8 0.59  CALCIUM 8.7 9.0 9.0   Liver Function Tests:  Recent Labs Lab 05/12/14 1601 05/15/14 1310  AST 27 38*  ALT 19 31  ALKPHOS 59 67  BILITOT 0.7 1.10  PROT 7.2 7.0  ALBUMIN 4.3 3.9    CBC:  Recent Labs Lab 05/14/14 1214 05/15/14 1310 05/15/14 2131 05/16/14 0600  WBC 3.5* 5.6 6.0 6.4  NEUTROABS 1.5 3.9 3.6  --   HGB 6.4* 7.3* 6.7* 7.4*  HCT 23.7* 26.0* 23.7* 25.0*  MCV 69.7* 71.8* 72.3* 72.7*  PLT 45* 76* 71* 84*   Cardiac Enzymes:  Recent Labs Lab 05/16/14 0600  TROPONINI <0.30   SIGNED: Time coordinating discharge: Over 30 minutes  Faye Ramsay, MD  [facility] 05/16/2014, 8:46 AM Pager [phone]  If 7PM-7AM, please contact night-coverage [email or web address] Password [email or web address]

## 2014-05-17 LAB — TYPE AND SCREEN
ABO/RH(D): O POS
Antibody Screen: NEGATIVE
Unit division: 0

## 2014-05-18 ENCOUNTER — Other Ambulatory Visit: Payer: Self-pay | Admitting: Hematology and Oncology

## 2014-05-18 ENCOUNTER — Telehealth: Payer: Self-pay | Admitting: Medical Oncology

## 2014-05-18 DIAGNOSIS — G4700 Insomnia, unspecified: Secondary | ICD-10-CM

## 2014-05-18 MED ORDER — ZOLPIDEM TARTRATE 10 MG PO TABS: 10.0000 mg | ORAL_TABLET | Freq: Every evening | ORAL | Status: DC | PRN

## 2014-05-18 NOTE — Telephone Encounter (Signed)
Per MD, informed patient reason of Hgb drop. Patient gave verbal understandin.  Patient reports headache better "took two days to get better, I have taken tylenol and ibuprofen and today it is manageable." Also, would like prescription for Ambien, states she was given Ambien in hospital and it worked much better for her. Patient is aware that she will need to p.u prescription.

## 2014-05-18 NOTE — Telephone Encounter (Signed)
VMOM: Patient called this morning stating that Friday after receiving fluids and antinausea meds she ended up going to ED d/t Hgb dropping from 7.1 down to 6.7 within a day, states received blood transfusion. Patient following up with you, wanting to know whether you wanted her to come in for you to see her. Also reports trazadone not working.  MD inboxed for review.

## 2014-05-18 NOTE — Telephone Encounter (Signed)
The drop was due to hydration. Is her headache better? I can prescribe Ambien for sleep but she have to come in to get the prescription

## 2014-05-19 NOTE — ED Provider Notes (Signed)
Medical screening examination/treatment/procedure(s) were performed by non-physician practitioner and as supervising physician I was immediately available for consultation/collaboration.   EKG Interpretation None        Tanna Furry, MD 05/19/14 [phone]

## 2014-05-22 ENCOUNTER — Telehealth: Payer: Self-pay | Admitting: Hematology and Oncology

## 2014-05-22 ENCOUNTER — Telehealth: Payer: Self-pay | Admitting: *Deleted

## 2014-05-22 ENCOUNTER — Other Ambulatory Visit (HOSPITAL_BASED_OUTPATIENT_CLINIC_OR_DEPARTMENT_OTHER): Payer: Medicaid Other

## 2014-05-22 ENCOUNTER — Encounter: Payer: Self-pay | Admitting: Hematology and Oncology

## 2014-05-22 ENCOUNTER — Ambulatory Visit (HOSPITAL_BASED_OUTPATIENT_CLINIC_OR_DEPARTMENT_OTHER): Payer: Medicaid Other | Admitting: Hematology and Oncology

## 2014-05-22 VITALS — BP 117/74 | HR 68 | Temp 98.4°F | Resp 18 | Ht 65.0 in | Wt 153.2 lb

## 2014-05-22 DIAGNOSIS — D638 Anemia in other chronic diseases classified elsewhere: Secondary | ICD-10-CM

## 2014-05-22 DIAGNOSIS — D75839 Thrombocytosis, unspecified: Secondary | ICD-10-CM

## 2014-05-22 DIAGNOSIS — D473 Essential (hemorrhagic) thrombocythemia: Secondary | ICD-10-CM

## 2014-05-22 DIAGNOSIS — D509 Iron deficiency anemia, unspecified: Secondary | ICD-10-CM

## 2014-05-22 DIAGNOSIS — E538 Deficiency of other specified B group vitamins: Secondary | ICD-10-CM

## 2014-05-22 DIAGNOSIS — G44011 Episodic cluster headache, intractable: Secondary | ICD-10-CM

## 2014-05-22 DIAGNOSIS — E559 Vitamin D deficiency, unspecified: Secondary | ICD-10-CM

## 2014-05-22 DIAGNOSIS — R51 Headache: Secondary | ICD-10-CM

## 2014-05-22 DIAGNOSIS — R519 Headache, unspecified: Secondary | ICD-10-CM

## 2014-05-22 HISTORY — DX: Headache, unspecified: R51.9

## 2014-05-22 LAB — CBC & DIFF AND RETIC
BASO%: 0.1 % (ref 0.0–2.0)
Basophils Absolute: 0 10*3/uL (ref 0.0–0.1)
EOS%: 0.4 % (ref 0.0–7.0)
Eosinophils Absolute: 0 10*3/uL (ref 0.0–0.5)
HCT: 30.8 % — ABNORMAL LOW (ref 34.8–46.6)
HGB: 8.8 g/dL — ABNORMAL LOW (ref 11.6–15.9)
Immature Retic Fract: 12 % — ABNORMAL HIGH (ref 1.60–10.00)
LYMPH%: 12.7 % — ABNORMAL LOW (ref 14.0–49.7)
MCH: 21.8 pg — AB (ref 25.1–34.0)
MCHC: 28.6 g/dL — AB (ref 31.5–36.0)
MCV: 76.4 fL — AB (ref 79.5–101.0)
MONO#: 0.4 10*3/uL (ref 0.1–0.9)
MONO%: 4.4 % (ref 0.0–14.0)
NEUT#: 7.4 10*3/uL — ABNORMAL HIGH (ref 1.5–6.5)
NEUT%: 82.4 % — ABNORMAL HIGH (ref 38.4–76.8)
Platelets: 588 10*3/uL — ABNORMAL HIGH (ref 145–400)
RBC: 4.03 10*6/uL (ref 3.70–5.45)
RDW: 28.6 % — AB (ref 11.2–14.5)
RETIC %: 1.76 % (ref 0.70–2.10)
RETIC CT ABS: 70.93 10*3/uL (ref 33.70–90.70)
WBC: 8.9 10*3/uL (ref 3.9–10.3)
lymph#: 1.1 10*3/uL (ref 0.9–3.3)

## 2014-05-22 LAB — HOLD TUBE, BLOOD BANK

## 2014-05-22 LAB — FERRITIN CHCC: Ferritin: 406 ng/ml — ABNORMAL HIGH (ref 9–269)

## 2014-05-22 LAB — TECHNOLOGIST REVIEW

## 2014-05-22 MED ORDER — HYDROMORPHONE HCL 2 MG PO TABS: 2.0000 mg | ORAL_TABLET | Freq: Three times a day (TID) | ORAL | Status: DC | PRN

## 2014-05-22 MED ORDER — BUTALBITAL-APAP-CAFFEINE 50-325-40 MG PO TABS: 1.0000 | ORAL_TABLET | Freq: Four times a day (QID) | ORAL | Status: AC | PRN

## 2014-05-22 NOTE — Telephone Encounter (Signed)
Can she come in for recheck labs, transfuse prn, IVF and see me today?

## 2014-05-22 NOTE — Telephone Encounter (Signed)
Pt says she can come in today and will be here as soon as she can.

## 2014-05-22 NOTE — Telephone Encounter (Signed)
Pt confirmed labs/ov per 08/28 POF, gave pt AVS....KJ °

## 2014-05-22 NOTE — Telephone Encounter (Signed)
added appts...Marland Kitchenper pof pt aware

## 2014-05-22 NOTE — Telephone Encounter (Signed)
Informed pt of Ferritin level and Dr. Alvy Bimler recommends lab and office visit in 3 months w lab one week prior to office visit.  Pt states she is ok w/ this plan.  Instructed her to expect a call from [place].  Order sent to scheduling.

## 2014-05-22 NOTE — Telephone Encounter (Signed)
Message copied by Cathlean Cower on Fri May 22, 2014  3:53 PM ------      Message from: [facility], [place]: Fri May 22, 2014  2:54 PM      Regarding: iron level       Pls let her know ferritin is over 300. I recommend reschedule her lab appt and see me to 3 months, with lab appt 1 week prior. If she is OK with that, please reschedule      ----- Message -----         From: [facility]         Sent: 05/22/2014  12:36 PM           To: Heath Lark, MD                   ------

## 2014-05-22 NOTE — Telephone Encounter (Signed)
Pt reports headache, nausea for past 2 to 3 days.  H/A has gotten so bad that hydrocodone is not helping at all.  Pt also trying ibuprofen and tylenol w/o any relief of her headache.  She is worried she is dehydrated although states she is able to drink plenty of water.  Has vomited a few times in the past few days but only small amt per pt.. Denies sob or dizziness. Denies fever.  Has a little loose stool but denies diarrhea.

## 2014-05-23 LAB — VITAMIN D 25 HYDROXY (VIT D DEFICIENCY, FRACTURES): Vit D, 25-Hydroxy: 10 ng/mL — ABNORMAL LOW (ref 30–89)

## 2014-05-23 LAB — VITAMIN B12: Vitamin B-12: 530 pg/mL (ref 211–911)

## 2014-05-24 ENCOUNTER — Encounter: Payer: Self-pay | Admitting: Hematology and Oncology

## 2014-05-24 DIAGNOSIS — D473 Essential (hemorrhagic) thrombocythemia: Secondary | ICD-10-CM | POA: Insufficient documentation

## 2014-05-24 DIAGNOSIS — D75839 Thrombocytosis, unspecified: Secondary | ICD-10-CM | POA: Insufficient documentation

## 2014-05-24 NOTE — Assessment & Plan Note (Signed)
Initially, she had severe thrombocytopenia due to a severe vitamin B12 deficiency. Since her vitamin B12 replacement, she has now moderate thrombocytosis, likely reactive in nature. Recommend observation only.

## 2014-05-24 NOTE — Assessment & Plan Note (Signed)
She had received recent blood transfusion and iron infusion. The serum ferritin is adequate.  I will see her back in 3 months with repeat blood work prior to her return visit. The patient will likely need iron replacement therapy in the future.

## 2014-05-24 NOTE — Assessment & Plan Note (Signed)
She had received multiple vitamin B12 injections. I recommend high dose vitamin B12 replacement therapy.

## 2014-05-24 NOTE — Assessment & Plan Note (Signed)
This is likely related to rebound headaches performed recent severe anemia. I gave her prescriptions Fioricet to take and a small prescription of hydromorphone to take as needed. We discussed narcotic refill policy.

## 2014-05-24 NOTE — Assessment & Plan Note (Signed)
This is related to severe malabsorption. I recommend high-dose vitamin D replacement with 5000 units daily.

## 2014-05-24 NOTE — Progress Notes (Signed)
[facility] OFFICE PROGRESS NOTE  ADVANI, [name], MD SUMMARY OF HEMATOLOGIC HISTORY: She was found to have abnormal CBC from recent blood work from her primary care physician's office. The total white blood cell count was 4.3, hemoglobin 6.4, MCV of 65.3 and platelet count of 73,000. She also has severe vitamin D deficiency, less than 10. She denies chest pain but she has severe shortness of breath on minimal exertion, pre-syncopal episodes, palpitations leg cramps and restless leg syndrome. She has history of gastric bypass surgery 9 what years ago and has lost 110 pounds of weight. She new of prior history of mineral deficiency is but were not taking any form of vitamin replacement therapy consistently. She never donated blood. She had received blood transfusion in the past during her gastric bypass surgery. The patient was prescribed oral iron supplements but she is not taking it due to severe diarrhea with oral iron supplement The patient also complained of insomnia. She is constipated for one week. She received one unit of blood and IV iron along with vitamin B12 injection on 05/14/2014. On 05/15/2014, she received another unit of blood transfusion. INTERVAL HISTORY: Haley Martinez 33 y.o. female returns for urgent evaluation because of nausea and severe headache. She is complaining of headaches rating at 8/10 pain since recent hospitalization. She states that she is compliant taking oral vitamin B12 replacement therapy and vitamin D. She denies any focal neurological deficits. No blurriness of vision.  I have reviewed the past medical history, past surgical history, social history and family history with the patient and they are unchanged from previous note.  ALLERGIES:  is allergic to trazodone and nefazodone.  MEDICATIONS:  Current Outpatient Prescriptions  Medication Sig Dispense Refill  . HYDROcodone-acetaminophen (NORCO/VICODIN) 5-325 MG per tablet Take 1 tablet by mouth  every 6 (six) hours as needed for moderate pain.  30 tablet  0  . ibuprofen (ADVIL,MOTRIN) 200 MG tablet Take 400 mg by mouth every 8 (eight) hours as needed. For pain.      . polyethylene glycol (MIRALAX) packet Take 17 g by mouth daily.  14 each  8  . PRESCRIPTION MEDICATION Inject into the vein once. Patient stated that she received IV iron, vitamin b12 and a blood [facility]14 and IV nausea and pain meds from there today (05/15/14)      . traZODone (DESYREL) 50 MG tablet Take 100 mg by mouth at bedtime as needed for sleep.      Marland Kitchen zolpidem (AMBIEN) 10 MG tablet Take 1 tablet (10 mg total) by mouth at bedtime as needed for sleep.  30 tablet  0  . butalbital-acetaminophen-caffeine (FIORICET) 50-325-40 MG per tablet Take 1 tablet by mouth every 6 (six) hours as needed for headache.  90 tablet  0  . HYDROmorphone (DILAUDID) 2 MG tablet Take 1 tablet (2 mg total) by mouth 3 (three) times daily as needed for severe pain.  60 tablet  0   No current facility-administered medications for this visit.     REVIEW OF SYSTEMS:   Constitutional: Denies fevers, chills or night sweats Eyes: Denies blurriness of vision Ears, nose, mouth, throat, and face: Denies mucositis or sore throat Respiratory: Denies cough, dyspnea or wheezes Cardiovascular: Denies palpitation, chest discomfort or lower extremity swelling Gastrointestinal:  Denies nausea, heartburn or change in bowel habits Skin: Denies abnormal skin rashes Lymphatics: Denies new lymphadenopathy or easy bruising Neurological:Denies numbness, tingling or new weaknesses Behavioral/Psych: Mood is stable,  no new changes  All other systems were reviewed with the patient and are negative.  PHYSICAL EXAMINATION: ECOG PERFORMANCE STATUS: 1 - Symptomatic but completely ambulatory  Filed Vitals:   05/22/14 1237  BP: 117/74  Pulse: 68  Temp: 98.4 F (36.9 C)  Resp: 18   Filed Weights   05/22/14 1237  Weight:  153 lb 3.2 oz (69.491 kg)    GENERAL:alert, no distress and comfortable SKIN: skin color, texture, turgor are normal, no rashes or significant lesions EYES: normal, Conjunctiva are pale and non-injected, sclera clear OROPHARYNX:no exudate, no erythema and lips, buccal mucosa, and tongue normal  NECK: supple, thyroid normal size, non-tender, without nodularity LYMPH:  no palpable lymphadenopathy in the cervical, axillary or inguinal LUNGS: clear to auscultation and percussion with normal breathing effort HEART: regular rate & rhythm and no murmurs and no lower extremity edema ABDOMEN:abdomen soft, non-tender and normal bowel sounds Musculoskeletal:no cyanosis of digits and no clubbing  NEURO: alert & oriented x 3 with fluent speech, no focal motor/sensory deficits  LABORATORY DATA:  I have reviewed the data as listed No results found for this or any previous visit (from the past 48 hour(s)).  Lab Results  Component Value Date   WBC 8.9 05/22/2014   HGB 8.8* 05/22/2014   HCT 30.8* 05/22/2014   MCV 76.4* 05/22/2014   PLT 588* 05/22/2014   ASSESSMENT & PLAN:  Iron deficiency anemia She had received recent blood transfusion and iron infusion. The serum ferritin is adequate.  I will see her back in 3 months with repeat blood work prior to her return visit. The patient will likely need iron replacement therapy in the future.  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 I recommend high-dose vitamin D replacement with 5000 units daily.    B12 DEFICIENCY She had received multiple vitamin B12 injections. I recommend high dose vitamin B12 replacement therapy.  Thrombocytosis Initially, she had severe thrombocytopenia due to a severe vitamin B12 deficiency. Since her vitamin B12 replacement, she has now moderate thrombocytosis, likely reactive in nature. Recommend observation only.  Headache This is likely related to rebound headaches performed recent  severe anemia. I gave her prescriptions Fioricet to take and a small prescription of hydromorphone to take as needed. We discussed narcotic refill policy.    All questions were answered. The patient knows to call the clinic with any problems, questions or concerns. No barriers to learning was detected.  I spent 25 minutes counseling the patient face to face. The total time spent in the appointment was 30 minutes and more than 50% was on counseling.     [facility], Jaeli Grubb, MD 05/24/2014 9:20 PM

## 2014-05-28 ENCOUNTER — Encounter (HOSPITAL_COMMUNITY): Payer: Self-pay | Admitting: Emergency Medicine

## 2014-05-28 ENCOUNTER — Inpatient Hospital Stay (HOSPITAL_COMMUNITY)
Admission: EM | Admit: 2014-05-28 | Discharge: 2014-05-29 | DRG: 864 | Disposition: A | Discharge status: Home / Self Care | Payer: Medicaid Other | Attending: Internal Medicine | Admitting: Internal Medicine

## 2014-05-28 ENCOUNTER — Inpatient Hospital Stay (HOSPITAL_COMMUNITY): Payer: Medicaid Other

## 2014-05-28 ENCOUNTER — Emergency Department (HOSPITAL_COMMUNITY): Payer: Medicaid Other

## 2014-05-28 DIAGNOSIS — E282 Polycystic ovarian syndrome: Secondary | ICD-10-CM | POA: Diagnosis present

## 2014-05-28 DIAGNOSIS — G44029 Chronic cluster headache, not intractable: Secondary | ICD-10-CM

## 2014-05-28 DIAGNOSIS — D509 Iron deficiency anemia, unspecified: Secondary | ICD-10-CM | POA: Diagnosis present

## 2014-05-28 DIAGNOSIS — Z9884 Bariatric surgery status: Secondary | ICD-10-CM

## 2014-05-28 DIAGNOSIS — G4700 Insomnia, unspecified: Secondary | ICD-10-CM

## 2014-05-28 DIAGNOSIS — R509 Fever, unspecified: Principal | ICD-10-CM | POA: Diagnosis present

## 2014-05-28 DIAGNOSIS — R51 Headache: Secondary | ICD-10-CM | POA: Diagnosis present

## 2014-05-28 DIAGNOSIS — D638 Anemia in other chronic diseases classified elsewhere: Secondary | ICD-10-CM | POA: Diagnosis present

## 2014-05-28 DIAGNOSIS — G2581 Restless legs syndrome: Secondary | ICD-10-CM | POA: Diagnosis present

## 2014-05-28 DIAGNOSIS — E538 Deficiency of other specified B group vitamins: Secondary | ICD-10-CM

## 2014-05-28 DIAGNOSIS — D473 Essential (hemorrhagic) thrombocythemia: Secondary | ICD-10-CM | POA: Diagnosis present

## 2014-05-28 DIAGNOSIS — IMO0001 Reserved for inherently not codable concepts without codable children: Secondary | ICD-10-CM | POA: Diagnosis present

## 2014-05-28 DIAGNOSIS — E559 Vitamin D deficiency, unspecified: Secondary | ICD-10-CM

## 2014-05-28 DIAGNOSIS — M791 Myalgia, unspecified site: Secondary | ICD-10-CM

## 2014-05-28 DIAGNOSIS — D75839 Thrombocytosis, unspecified: Secondary | ICD-10-CM

## 2014-05-28 LAB — BASIC METABOLIC PANEL
Anion gap: 13 (ref 5–15)
BUN: 7 mg/dL (ref 6–23)
CHLORIDE: 101 meq/L (ref 96–112)
CO2: 23 meq/L (ref 19–32)
CREATININE: 0.69 mg/dL (ref 0.50–1.10)
Calcium: 8.9 mg/dL (ref 8.4–10.5)
GFR calc Af Amer: >90 mL/min (ref >90)
GFR calc non Af Amer: >90 mL/min (ref >90)
Glucose, Bld: 104 mg/dL — ABNORMAL HIGH (ref 70–99)
Potassium: 4.3 mEq/L (ref 3.7–5.3)
Sodium: 137 mEq/L (ref 137–147)

## 2014-05-28 LAB — CBC WITH DIFFERENTIAL/PLATELET
BASOS PCT: 0 % (ref 0–1)
Basophils Absolute: 0 10*3/uL (ref 0.0–0.1)
Basophils Absolute: 0 10*3/uL (ref 0.0–0.1)
Basophils Relative: 0 % (ref 0–1)
EOS ABS: 0 10*3/uL (ref 0.0–0.7)
EOS PCT: 0 % (ref 0–5)
Eosinophils Absolute: 0 10*3/uL (ref 0.0–0.7)
Eosinophils Relative: 0 % (ref 0–5)
HCT: 31.8 % — ABNORMAL LOW (ref 36.0–46.0)
HCT: 31.6 % — ABNORMAL LOW (ref 36.0–46.0)
HEMOGLOBIN: 9.2 g/dL — AB (ref 12.0–15.0)
Hemoglobin: 9.6 g/dL — ABNORMAL LOW (ref 12.0–15.0)
LYMPHS ABS: 0.6 10*3/uL — AB (ref 0.7–4.0)
LYMPHS PCT: 7 % — AB (ref 12–46)
Lymphocytes Relative: 5 % — ABNORMAL LOW (ref 12–46)
Lymphs Abs: 0.7 10*3/uL (ref 0.7–4.0)
MCH: 22.6 pg — ABNORMAL LOW (ref 26.0–34.0)
MCH: 22.5 pg — ABNORMAL LOW (ref 26.0–34.0)
MCHC: 30.2 g/dL (ref 30.0–36.0)
MCHC: 29.1 g/dL — ABNORMAL LOW (ref 30.0–36.0)
MCV: 75 fL — AB (ref 78.0–100.0)
MCV: 77.3 fL — ABNORMAL LOW (ref 78.0–100.0)
MONOS PCT: 6 % (ref 3–12)
Monocytes Absolute: 0.7 10*3/uL (ref 0.1–1.0)
Monocytes Absolute: 0.5 10*3/uL (ref 0.1–1.0)
Monocytes Relative: 5 % (ref 3–12)
NEUTROS PCT: 88 % — AB (ref 43–77)
Neutro Abs: 9.7 10*3/uL — ABNORMAL HIGH (ref 1.7–7.7)
Neutro Abs: 9.3 10*3/uL — ABNORMAL HIGH (ref 1.7–7.7)
Neutrophils Relative %: 89 % — ABNORMAL HIGH (ref 43–77)
Platelets: 708 10*3/uL — ABNORMAL HIGH (ref 150–400)
Platelets: 675 10*3/uL — ABNORMAL HIGH (ref 150–400)
RBC: 4.24 MIL/uL (ref 3.87–5.11)
RBC: 4.09 MIL/uL (ref 3.87–5.11)
RDW: 25.9 % — ABNORMAL HIGH (ref 11.5–15.5)
RDW: 26 % — ABNORMAL HIGH (ref 11.5–15.5)
WBC: 11 10*3/uL — AB (ref 4.0–10.5)
WBC: 10.5 10*3/uL (ref 4.0–10.5)

## 2014-05-28 LAB — APTT: aPTT: 32 seconds (ref 24–37)

## 2014-05-28 LAB — COMPREHENSIVE METABOLIC PANEL
ALBUMIN: 3.5 g/dL (ref 3.5–5.2)
ALT: 31 U/L (ref 0–35)
AST: 40 U/L — AB (ref 0–37)
Alkaline Phosphatase: 56 U/L (ref 39–117)
Anion gap: 12 (ref 5–15)
BUN: 6 mg/dL (ref 6–23)
CO2: 22 mEq/L (ref 19–32)
Calcium: 8.6 mg/dL (ref 8.4–10.5)
Chloride: 104 mEq/L (ref 96–112)
Creatinine, Ser: 0.58 mg/dL (ref 0.50–1.10)
GFR calc Af Amer: >90 mL/min (ref >90)
GFR calc non Af Amer: >90 mL/min (ref >90)
Glucose, Bld: 160 mg/dL — ABNORMAL HIGH (ref 70–99)
Potassium: 4.6 mEq/L (ref 3.7–5.3)
Sodium: 138 mEq/L (ref 137–147)
Total Bilirubin: 1.2 mg/dL (ref 0.3–1.2)
Total Protein: 6.7 g/dL (ref 6.0–8.3)

## 2014-05-28 LAB — PREGNANCY, URINE: PREG TEST UR: NEGATIVE

## 2014-05-28 LAB — I-STAT CG4 LACTIC ACID, ED: Lactic Acid, Venous: 2.34 mmol/L — ABNORMAL HIGH (ref 0.5–2.2)

## 2014-05-28 LAB — PROTIME-INR
INR: 1.09 (ref 0.00–1.49)
Prothrombin Time: 14.1 seconds (ref 11.6–15.2)

## 2014-05-28 LAB — TSH: TSH: 0.387 u[IU]/mL (ref 0.350–4.500)

## 2014-05-28 LAB — MAGNESIUM: Magnesium: 2 mg/dL (ref 1.5–2.5)

## 2014-05-28 LAB — URINALYSIS, ROUTINE W REFLEX MICROSCOPIC
Bilirubin Urine: NEGATIVE
Glucose, UA: NEGATIVE mg/dL
Hgb urine dipstick: NEGATIVE
Ketones, ur: NEGATIVE mg/dL
LEUKOCYTES UA: NEGATIVE
Nitrite: NEGATIVE
PH: 7.5 (ref 5.0–8.0)
Protein, ur: NEGATIVE mg/dL
SPECIFIC GRAVITY, URINE: 1.005 (ref 1.005–1.030)
Urobilinogen, UA: 1 mg/dL (ref 0.0–1.0)

## 2014-05-28 LAB — PHOSPHORUS: PHOSPHORUS: 4.4 mg/dL (ref 2.3–4.6)

## 2014-05-28 MED ORDER — ONDANSETRON HCL 4 MG PO TABS: 4.0000 mg | ORAL_TABLET | Freq: Four times a day (QID) | ORAL | Status: DC | PRN

## 2014-05-28 MED ORDER — ACETAMINOPHEN 500 MG PO TABS
1000.0000 mg | ORAL_TABLET | Freq: Once | ORAL | Status: AC
  Administered 2014-05-28: 1000 mg via ORAL
  Filled 2014-05-28: qty 2

## 2014-05-28 MED ORDER — HYDROCODONE-ACETAMINOPHEN 5-325 MG PO TABS
1.0000 | ORAL_TABLET | Freq: Four times a day (QID) | ORAL | Status: DC | PRN
  Administered 2014-05-28 – 2014-05-29 (×5): 1 via ORAL
  Filled 2014-05-28 (×5): qty 1

## 2014-05-28 MED ORDER — HYDROMORPHONE HCL PF 2 MG/ML IJ SOLN
2.0000 mg | Freq: Once | INTRAMUSCULAR | Status: AC
  Administered 2014-05-28: 2 mg via INTRAVENOUS
  Filled 2014-05-28: qty 1

## 2014-05-28 MED ORDER — ONDANSETRON HCL 4 MG/2ML IJ SOLN
4.0000 mg | Freq: Once | INTRAMUSCULAR | Status: AC
  Administered 2014-05-28: 4 mg via INTRAVENOUS
  Filled 2014-05-28: qty 2

## 2014-05-28 MED ORDER — ONDANSETRON HCL 4 MG/2ML IJ SOLN: 4.0000 mg | Freq: Three times a day (TID) | INTRAMUSCULAR | Status: DC | PRN

## 2014-05-28 MED ORDER — HYDROMORPHONE HCL PF 2 MG/ML IJ SOLN
2.0000 mg | INTRAMUSCULAR | Status: AC | PRN
  Administered 2014-05-28 (×2): 2 mg via INTRAVENOUS
  Filled 2014-05-28 (×3): qty 1

## 2014-05-28 MED ORDER — SODIUM CHLORIDE 0.9 % IV SOLN: 1000.0000 mL | Freq: Once | INTRAVENOUS | Status: AC

## 2014-05-28 MED ORDER — ACETAMINOPHEN 325 MG PO TABS
650.0000 mg | ORAL_TABLET | Freq: Four times a day (QID) | ORAL | Status: DC | PRN
  Administered 2014-05-28 – 2014-05-29 (×2): 650 mg via ORAL
  Filled 2014-05-28 (×2): qty 2

## 2014-05-28 MED ORDER — LEVOFLOXACIN IN D5W 750 MG/150ML IV SOLN
750.0000 mg | INTRAVENOUS | Status: DC
  Administered 2014-05-28: 750 mg via INTRAVENOUS
  Filled 2014-05-28 (×2): qty 150

## 2014-05-28 MED ORDER — ONDANSETRON HCL 4 MG/2ML IJ SOLN: 4.0000 mg | Freq: Four times a day (QID) | INTRAMUSCULAR | Status: DC | PRN

## 2014-05-28 MED ORDER — SODIUM CHLORIDE 0.9 % IV SOLN
1000.0000 mL | INTRAVENOUS | Status: DC
  Administered 2014-05-28 – 2014-05-29 (×3): 1000 mL via INTRAVENOUS

## 2014-05-28 MED ORDER — ACETAMINOPHEN 650 MG RE SUPP: 650.0000 mg | Freq: Four times a day (QID) | RECTAL | Status: DC | PRN

## 2014-05-28 MED ORDER — SODIUM CHLORIDE 0.9 % IV SOLN
1000.0000 mL | Freq: Once | INTRAVENOUS | Status: AC
Start: 2014-05-28 — End: 2014-05-28
  Administered 2014-05-28: 1000 mL via INTRAVENOUS

## 2014-05-28 MED ORDER — POLYETHYLENE GLYCOL 3350 17 G PO PACK
17.0000 g | PACK | Freq: Every day | ORAL | Status: DC
Start: 2014-05-28 — End: 2014-05-29
  Administered 2014-05-28 – 2014-05-29 (×2): 17 g via ORAL
  Filled 2014-05-28 (×2): qty 1

## 2014-05-28 NOTE — ED Notes (Signed)
Pt given graham crackers and water, states she is feeling a little better and temperature has decreased to 99.5

## 2014-05-28 NOTE — ED Provider Notes (Signed)
CSN: [id number]     Arrival date & time 05/28/14  0309 History   First MD Initiated Contact with Patient 05/28/14 [phone]     Chief Complaint  Patient presents with  . Generalized Body Aches     (Consider location/radiation/quality/duration/timing/severity/associated sxs/prior Treatment) HPI Comments: Patient is a 33 year old female with a past medical history of PCOS, anemia of chronic disease, and history of gastric bypass surgery 9 years ago who presents with generalized myalgias. The myalgias started 2 weeks ago after a blood transfusion for symptomatic anemia but her generalized pain acutely worsened today. The pain is aching and severe without radiation. She has tried home medications including 2mg  PO dilaudid which provided no relief. Patient reports associated nausea. No aggravating/alleviating factors. No other associated symptoms. Patient sees Dr. Alvy Bimler for Hematology. Most recent transfusion 05/15/2014.   Past Medical History  Diagnosis Date  . Polycystic ovarian syndrome   . Anemia   . Insomnia   . Restless leg syndrome   . Constipation 05/14/2014  . Headache 05/22/2014   Past Surgical History  Procedure Laterality Date  . Gastric bypass     Family History  Problem Relation Age of Onset  . Heart disease Mother   . Hyperlipidemia Mother   . Hypertension Mother   . Hyperlipidemia Father   . Hypertension Father   . Cancer Maternal Uncle     pancreatic ca  . Drug abuse Maternal Grandmother   . Drug abuse Paternal Grandmother   . Stroke Paternal Grandfather    History  Substance Use Topics  . Smoking status: Never Smoker   . Smokeless tobacco: Never Used  . Alcohol Use: Yes     Comment: occasionally   OB History   Grav Para Term Preterm Abortions TAB SAB Ect Mult Living   3 1 1  0 2 0 2 0 0 1     Review of Systems  Constitutional: Positive for fever. Negative for chills and fatigue.  HENT: Negative for trouble swallowing.   Eyes: Negative for visual disturbance.   Respiratory: Negative for shortness of breath.   Cardiovascular: Negative for chest pain and palpitations.  Gastrointestinal: Positive for nausea. Negative for vomiting, abdominal pain and diarrhea.  Genitourinary: Negative for dysuria and difficulty urinating.  Musculoskeletal: Positive for myalgias. Negative for arthralgias and neck pain.  Skin: Negative for color change.  Neurological: Negative for dizziness and weakness.  Psychiatric/Behavioral: Negative for dysphoric mood.      Allergies  Trazodone and nefazodone  Home Medications   Prior to Admission medications   Medication Sig Start Date End Date Taking? Authorizing Provider  butalbital-acetaminophen-caffeine (FIORICET) 50-325-40 MG per tablet Take 1 tablet by mouth every 6 (six) hours as needed for headache. 05/22/14 05/22/15 Yes Heath Lark, MD  HYDROcodone-acetaminophen (NORCO/VICODIN) 5-325 MG per tablet Take 1 tablet by mouth every 6 (six) hours as needed for moderate pain. 05/16/14  Yes Theodis Blaze, MD  HYDROmorphone (DILAUDID) 2 MG tablet Take 1 tablet (2 mg total) by mouth 3 (three) times daily as needed for severe pain. 05/22/14  Yes Heath Lark, MD  ibuprofen (ADVIL,MOTRIN) 200 MG tablet Take 400 mg by mouth every 8 (eight) hours as needed. For pain.   Yes Historical Provider, MD  traZODone (DESYREL) 50 MG tablet Take 100 mg by mouth at bedtime as needed for sleep.   Yes Historical Provider, MD  polyethylene glycol (MIRALAX) packet Take 17 g by mouth daily. 05/14/14   Heath Lark, MD   BP 122/59  Pulse 81  Temp(Src) 102.9 F (39.4 C) (Oral)  Resp 18  Ht 5\' 5"  (1.651 m)  Wt 153 lb (69.4 kg)  BMI 25.46 kg/m2  SpO2 99%  LMP 04/14/2014 Physical Exam  Nursing note and vitals reviewed. Constitutional: She appears well-developed and well-nourished. No distress.  HENT:  Head: Normocephalic and atraumatic.  Mouth/Throat: Oropharynx is clear and moist. No oropharyngeal exudate.  Eyes: Conjunctivae and EOM are normal.   Neck: Normal range of motion.  Cardiovascular: Normal rate and regular rhythm.  Exam reveals no gallop and no friction rub.   No murmur heard. Pulmonary/Chest: Effort normal and breath sounds normal. She has no wheezes. She has no rales. She exhibits no tenderness.  Abdominal: Soft. She exhibits no distension. There is tenderness. There is no rebound and no guarding.  Mild LLQ tenderness to palpation. No other focal tenderness or peritoneal signs.   Musculoskeletal: Normal range of motion.  Lymphadenopathy:    She has no cervical adenopathy.  Neurological: She is alert.  Speech is goal-oriented. Moves limbs without ataxia.   Skin: Skin is warm and dry.  Psychiatric: She has a normal mood and affect. Her behavior is normal.    ED Course  Procedures (including critical care time) Labs Review Labs Reviewed  CBC WITH DIFFERENTIAL - Abnormal; Notable for the following:    WBC 11.0 (*)    Hemoglobin 9.6 (*)    HCT 31.8 (*)    MCV 75.0 (*)    MCH 22.6 (*)    RDW 25.9 (*)    Platelets 708 (*)    Neutrophils Relative % 89 (*)    Lymphocytes Relative 5 (*)    Neutro Abs 9.7 (*)    Lymphs Abs 0.6 (*)    All other components within normal limits  BASIC METABOLIC PANEL - Abnormal; Notable for the following:    Glucose, Bld 104 (*)    All other components within normal limits  I-STAT CG4 LACTIC ACID, ED - Abnormal; Notable for the following:    Lactic Acid, Venous 2.34 (*)    All other components within normal limits  CULTURE, BLOOD (ROUTINE X 2)  CULTURE, BLOOD (ROUTINE X 2)  URINALYSIS, ROUTINE W REFLEX MICROSCOPIC  PREGNANCY, URINE    Imaging Review Dg Chest 2 View  05/28/2014   CLINICAL DATA:  Generalized body aches and pain.  EXAM: CHEST  2 VIEW  COMPARISON:  05/15/2014  FINDINGS: Cardiomediastinal contours are within normal range. Mild left hemidiaphragm elevation. No confluent airspace opacity, pleural effusion, or pneumothorax. No acute osseous finding. Surgical clips left  upper quadrant.  IMPRESSION: No radiographic evidence of active cardiopulmonary disease.   Electronically Signed   By: Carlos Levering M.D.   On: 05/28/2014 05:48     EKG Interpretation None      MDM   Final diagnoses:  Fever, unspecified fever cause  Myalgia    4:52 AM Patient's labs show mild elevation of WBC at 11. Patient is febrile at 102.9 with remaining vitals stable. Urinalysis unremarkable for infection. Chest xray pending. Patient given 2mg  IV dilaudid. Blood cultures ordered. Patient has elevated lactic acid at 2.34.   6:36 AM Chest xray unremarkable. Patient has fever of unknown origin. This is concerning due to her history and recent blood transfusion. Patient will be admitted for pain control and further evaluation.    Alvina Chou, [place] 05/28/14 [phone]

## 2014-05-28 NOTE — ED Provider Notes (Signed)
Medical screening examination/treatment/procedure(s) were performed by non-physician practitioner and as supervising physician I was immediately available for consultation/collaboration.   EKG Interpretation None       Kalman Drape, MD 05/28/14 [phone]

## 2014-05-28 NOTE — Progress Notes (Signed)
Pt called RN to room to check temp. Temp 100.6. MD notified. Orders received.

## 2014-05-28 NOTE — ED Notes (Signed)
Pt states that she has "all over body pain"; pt states that it has been off and on but constant today; pt c/o nausea; pt states that she had a blood transfusion 2 weeks ago and just has not felt right since

## 2014-05-28 NOTE — ED Notes (Signed)
Pt complains of generalized body aches and a fever since yesterday, she had a blood tranfusion two weeks ago and was last seen at [facility] Friday, she states that she feels weak and dehydrated

## 2014-05-28 NOTE — ED Notes (Signed)
Notified EDP, Otter,MD., pt. i-stat CG4 Lactic acid results 2.34.

## 2014-05-28 NOTE — H&P (Addendum)
[facility] History and Physical  Haley Martinez:[id number] DOB: 30-Apr-1981 DOA: 05/28/2014  Referring physician: ER physician PCP: Lorayne Marek, MD   Chief Complaint: fever, fatigue  HPI:  33 year old female with past medical history of gastric bypass surgery, related vitamin B 12 deficiency, history of iron deficiency anemia, recent admission for chest pain who presented to [facility] ED 05/28/2014 fevers and myalgias for past few days prior to this admission. Pt reported ongoing headache. No abdominal pain, nausea or vomiting. No cough. No chest pain. No shortness of breath or palpitations. No reports of burning sensation on urination. No lightheadedness or loss of consciousness. No reports of blood in stool or urine.   In ED, vitals were stable. Blood work showed WBC count of 11, hemoglobin of 9.6 and platelets of 708. Lactic acid was 2.34. She had T max of 102.9 F. Urinalysis was unremarkable. CXR showed no acute cardiopulmonary findings. Due to no obvious source of infection, abx treatment deferred on admission.   Assessment & Plan   Principal Problem:  Fever  Unclear etiology  Will follow up on blood culture results  Start Levaquin if pt spike fever  Active Problems:  Iron deficiency anemia   Hemoglobin is 9.6 on admission  No current indications for transfusion  Thrombocytosis  Unclear etiology; will notify Dr. Alvy Bimler of pt admission  DVT prophylaxis:   SCD's bilaterally   Radiological Exams on Admission: Dg Chest 2 View 05/28/2014  No radiographic evidence of active cardiopulmonary disease.      Code Status: Full Family Communication: Plan of care discussed with the patient  Disposition Plan: Admit for further evaluation  Leisa Lenz, MD  [facility] Pager [phone]  Review of Systems:  Constitutional: positive  for fever, chills and malaise/fatigue. Negative for diaphoresis.  HENT: Negative for hearing loss, ear pain, nosebleeds, congestion, sore  throat, neck pain, tinnitus and ear discharge.   Eyes: Negative for blurred vision, double vision, photophobia, pain, discharge and redness.  Respiratory: Negative for cough, hemoptysis, sputum production, shortness of breath, wheezing and stridor.   Cardiovascular: Negative for chest pain, palpitations, orthopnea, claudication and leg swelling.  Gastrointestinal: Negative for nausea, vomiting and abdominal pain. Negative for heartburn, constipation, blood in stool and melena.  Genitourinary: Negative for dysuria, urgency, frequency, hematuria and flank pain.  Musculoskeletal: Negative for myalgias, back pain, joint pain and falls.  Skin: Negative for itching and rash.  Neurological: Negative for dizziness and positive for weakness. Negative for tingling, tremors, sensory change, speech change, focal weakness, loss of consciousness and headaches.  Endo/Heme/Allergies: Negative for environmental allergies and polydipsia. Does not bruise/bleed easily.  Psychiatric/Behavioral: Negative for suicidal ideas. The patient is not nervous/anxious.      Past Medical History  Diagnosis Date  . Polycystic ovarian syndrome   . Anemia   . Insomnia   . Restless leg syndrome   . Constipation 05/14/2014  . Headache 05/22/2014   Past Surgical History  Procedure Laterality Date  . Gastric bypass     Social History:  reports that she has never smoked. She has never used smokeless tobacco. She reports that she drinks alcohol. She reports that she does not use illicit drugs.  Allergies  Allergen Reactions  . Trazodone And Nefazodone     "severe headache"    Family History:  Family History  Problem Relation Age of Onset  . Heart disease Mother   . Hyperlipidemia Mother   . Hypertension Mother   . Hyperlipidemia Father   .  Hypertension Father   . Cancer Maternal Uncle     pancreatic ca  . Drug abuse Maternal Grandmother   . Drug abuse Paternal Grandmother   . Stroke Paternal Grandfather       Prior to Admission medications   Medication Sig Start Date End Date Taking? Authorizing Provider  butalbital-acetaminophen-caffeine (FIORICET) 50-325-40 MG per tablet Take 1 tablet by mouth every 6 (six) hours as needed for headache. 05/22/14 05/22/15 Yes Heath Lark, MD  HYDROcodone-acetaminophen (NORCO/VICODIN) 5-325 MG per tablet Take 1 tablet by mouth every 6 (six) hours as needed for moderate pain. 05/16/14  Yes Theodis Blaze, MD  HYDROmorphone (DILAUDID) 2 MG tablet Take 1 tablet (2 mg total) by mouth 3 (three) times daily as needed for severe pain. 05/22/14  Yes Heath Lark, MD  ibuprofen (ADVIL,MOTRIN) 200 MG tablet Take 400 mg by mouth every 8 (eight) hours as needed. For pain.   Yes Historical Provider, MD  traZODone (DESYREL) 50 MG tablet Take 100 mg by mouth at bedtime as needed for sleep.   Yes Historical Provider, MD  polyethylene glycol (MIRALAX) packet Take 17 g by mouth daily. 05/14/14   Heath Lark, MD   Physical Exam: Filed Vitals:   05/28/14 0321 05/28/14 0623 05/28/14 0718  BP: 122/59  105/51  Pulse: 81  84  Temp: 102.9 F (39.4 C) 99.5 F (37.5 C)   TempSrc: Oral Oral   Resp: 18  17  Height: 5\' 5"  (1.651 m)    Weight: 69.4 kg (153 lb)    SpO2: 99%  98%    Physical Exam  Constitutional: Appears well-developed and well-nourished. No distress.  HENT: Normocephalic. No tonsillar erythema or exudates Eyes: Conjunctivae and EOM are normal. PERRLA, no scleral icterus.  Neck: Normal ROM. Neck supple. No JVD. No tracheal deviation. No thyromegaly.  CVS: RRR, S1/S2 +, no murmurs, no gallops, no carotid bruit.  Pulmonary: Effort and breath sounds normal, no stridor, rhonchi, wheezes, rales.  Abdominal: Soft. BS +,  no distension, tenderness, rebound or guarding.  Musculoskeletal: Normal range of motion. No edema and no tenderness.  Lymphadenopathy: No lymphadenopathy noted, cervical, inguinal. Neuro: Alert. Normal reflexes, muscle tone coordination. No focal neurologic  deficits. Skin: Skin is warm and dry. No rash noted. Not diaphoretic. No erythema. No pallor.  Psychiatric: Normal mood and affect. Behavior, judgment, thought content normal.   Labs on Admission:  Basic Metabolic Panel:  Recent Labs Lab 05/28/14 0354 05/28/14 0845  NA 137 138  K 4.3 4.6  CL 101 104  CO2 23 22  GLUCOSE 104* 160*  BUN 7 6  CREATININE 0.69 0.58  CALCIUM 8.9 8.6  MG  --  2.0  PHOS  --  4.4   Liver Function Tests:  Recent Labs Lab 05/28/14 0845  AST 40*  ALT 31  ALKPHOS 56  BILITOT 1.2  PROT 6.7  ALBUMIN 3.5   No results found for this basename: LIPASE, AMYLASE,  in the last 168 hours No results found for this basename: AMMONIA,  in the last 168 hours CBC:  Recent Labs Lab 05/22/14 1221 05/28/14 0354 05/28/14 0845  WBC 8.9 11.0* 10.5  NEUTROABS 7.4* 9.7* 9.3*  HGB 8.8* 9.6* 9.2*  HCT 30.8* 31.8* 31.6*  MCV 76.4* 75.0* 77.3*  PLT 588* 708* 675*   Cardiac Enzymes: No results found for this basename: CKTOTAL, CKMB, CKMBINDEX, TROPONINI,  in the last 168 hours BNP: No components found with this basename: POCBNP,  CBG: No results found for this basename:  GLUCAP,  in the last 168 hours  If 7PM-7AM, please contact night-coverage [email or web address] Password [email or web address] 05/28/2014, 10:32 AM

## 2014-05-29 DIAGNOSIS — G44029 Chronic cluster headache, not intractable: Secondary | ICD-10-CM

## 2014-05-29 DIAGNOSIS — G4700 Insomnia, unspecified: Secondary | ICD-10-CM

## 2014-05-29 DIAGNOSIS — E538 Deficiency of other specified B group vitamins: Secondary | ICD-10-CM

## 2014-05-29 LAB — CBC
HCT: 30.5 % — ABNORMAL LOW (ref 36.0–46.0)
Hemoglobin: 8.8 g/dL — ABNORMAL LOW (ref 12.0–15.0)
MCH: 22.4 pg — ABNORMAL LOW (ref 26.0–34.0)
MCHC: 28.9 g/dL — AB (ref 30.0–36.0)
MCV: 77.8 fL — ABNORMAL LOW (ref 78.0–100.0)
PLATELETS: 487 10*3/uL — AB (ref 150–400)
RBC: 3.92 MIL/uL (ref 3.87–5.11)
RDW: 25.6 % — ABNORMAL HIGH (ref 11.5–15.5)
WBC: 9.9 10*3/uL (ref 4.0–10.5)

## 2014-05-29 LAB — COMPREHENSIVE METABOLIC PANEL
ALT: 78 U/L — ABNORMAL HIGH (ref 0–35)
ANION GAP: 12 (ref 5–15)
AST: 86 U/L — ABNORMAL HIGH (ref 0–37)
Albumin: 3.2 g/dL — ABNORMAL LOW (ref 3.5–5.2)
Alkaline Phosphatase: 65 U/L (ref 39–117)
BILIRUBIN TOTAL: 1 mg/dL (ref 0.3–1.2)
BUN: 5 mg/dL — AB (ref 6–23)
CHLORIDE: 101 meq/L (ref 96–112)
CO2: 22 mEq/L (ref 19–32)
CREATININE: 0.62 mg/dL (ref 0.50–1.10)
Calcium: 8.6 mg/dL (ref 8.4–10.5)
GLUCOSE: 97 mg/dL (ref 70–99)
Potassium: 4.4 mEq/L (ref 3.7–5.3)
SODIUM: 135 meq/L — AB (ref 137–147)
Total Protein: 6.5 g/dL (ref 6.0–8.3)

## 2014-05-29 MED ORDER — DOXYCYCLINE HYCLATE 100 MG PO TABS: 100.0000 mg | ORAL_TABLET | Freq: Two times a day (BID) | ORAL | Status: DC

## 2014-05-29 MED ORDER — HYDROMORPHONE HCL PF 1 MG/ML IJ SOLN
1.0000 mg | Freq: Once | INTRAMUSCULAR | Status: AC
  Administered 2014-05-29: 1 mg via INTRAVENOUS
  Filled 2014-05-29: qty 1

## 2014-05-29 MED ORDER — LEVOFLOXACIN 750 MG PO TABS: 750.0000 mg | ORAL_TABLET | Freq: Every day | ORAL | Status: DC

## 2014-05-29 MED ORDER — DOXYCYCLINE HYCLATE 100 MG IV SOLR
100.0000 mg | Freq: Two times a day (BID) | INTRAVENOUS | Status: DC
  Administered 2014-05-29: 100 mg via INTRAVENOUS
  Filled 2014-05-29 (×2): qty 100

## 2014-05-29 MED ORDER — TRAMADOL HCL 50 MG PO TABS: 50.0000 mg | ORAL_TABLET | Freq: Four times a day (QID) | ORAL | Status: DC | PRN

## 2014-05-29 MED ORDER — TEMAZEPAM 7.5 MG PO CAPS: 7.5000 mg | ORAL_CAPSULE | Freq: Every evening | ORAL | Status: DC | PRN

## 2014-05-29 NOTE — Discharge Instructions (Signed)

## 2014-05-29 NOTE — Discharge Summary (Signed)
Physician Discharge Summary  Haley Martinez GOT:[id number] DOB: 06-Aug-1981 DOA: 05/28/2014  PCP: Lorayne Marek, MD  Admit date: 05/28/2014 Discharge date: 05/29/2014  Recommendations for Outpatient Follow-up:  1. Continue Levaquin and doxycycline for 7 days after discharge as prescribed. Please see your PCP in 1 week from discharge to make sure symptoms are controlled.  Discharge Diagnoses:  Active Problems:   Fever of unknown origin   Fever    Discharge Condition: stable   Diet recommendation: as tolerated   History of present illness:  33 year old female with past medical history of gastric bypass surgery, related vitamin B 12 deficiency, history of iron deficiency anemia, recent admission for chest pain who presented to [facility] ED 05/28/2014 fevers and myalgias for past few days prior to this admission. Pt reported ongoing headache. No abdominal pain, nausea or vomiting. No cough.  In ED, vitals were stable. Blood work showed WBC count of 11, hemoglobin of 9.6 and platelets of 708. Lactic acid was 2.34. She had T max of 102.9 F. Urinalysis was unremarkable. CXR showed no acute cardiopulmonary findings. Due to no obvious source of infection, abx treatment deferred on admission.   Assessment & Plan   Principal Problem:  Fever  Unclear etiology  Blood cultures to date are negative. I spoke with infectious disease who recommended empiric Levaquin and doxycycline. Followup with primary care physician should patient continue spiking fevers while on antibiotics.  Active Problems:  Iron deficiency anemia  Hemoglobin is 9.6 on admission  No current indications for transfusion  Thrombocytosis  Unclear etiology; will notify Dr. Alvy Bimler of pt admission  DVT prophylaxis:  SCD's bilaterally    Radiological Exams on Admission:  Dg Chest 2 View 05/28/2014 No radiographic evidence of active cardiopulmonary disease.    SignedLeisa Lenz, MD  [facility] 05/29/2014, 10:15 AM  Pager #:  [phone]   Discharge Exam: Filed Vitals:   05/29/14 0626  BP:   Pulse:   Temp: 99.5 F (37.5 C)  Resp:    Filed Vitals:   05/28/14 2051 05/29/14 0537 05/29/14 0626 05/29/14 0700  BP: 120/79 120/66    Pulse: 74 80    Temp: 100.2 F (37.9 C) 103.2 F (39.6 C) 99.5 F (37.5 C)   TempSrc: Oral Oral Oral   Resp: 18 16    Height:      Weight:    69.582 kg (153 lb 6.4 oz)  SpO2: 100% 97%      General: Pt is alert, follows commands appropriately, not in acute distress Cardiovascular: Regular rate and rhythm, S1/S2 +, no murmurs Respiratory: Clear to auscultation bilaterally, no wheezing, no crackles, no rhonchi Abdominal: Soft, non tender, non distended, bowel sounds +, no guarding Extremities: no edema, no cyanosis, pulses palpable bilaterally DP and PT Neuro: Grossly nonfocal  Discharge Instructions  Discharge Instructions   Call MD for:  difficulty breathing, headache or visual disturbances    Complete by:  As directed      Call MD for:  persistant dizziness or light-headedness    Complete by:  As directed      Call MD for:  persistant nausea and vomiting    Complete by:  As directed      Call MD for:  severe uncontrolled pain    Complete by:  As directed      Diet - low sodium heart healthy    Complete by:  As directed      Discharge instructions    Complete by:  As directed   1. Continue Levaquin and doxycycline for 7 days after discharge as prescribed. Please see your PCP in 1 week from discharge to make sure symptoms are controlled.     Increase activity slowly    Complete by:  As directed             Medication List    STOP taking these medications       HYDROcodone-acetaminophen 5-325 MG per tablet  Commonly known as:  NORCO/VICODIN     HYDROmorphone 2 MG tablet  Commonly known as:  DILAUDID     traZODone 50 MG tablet  Commonly known as:  DESYREL      TAKE these medications       butalbital-acetaminophen-caffeine 50-325-40 MG per tablet   Commonly known as:  FIORICET  Take 1 tablet by mouth every 6 (six) hours as needed for headache.     doxycycline 100 MG tablet  Commonly known as:  VIBRA-TABS  Take 1 tablet (100 mg total) by mouth 2 (two) times daily.     ibuprofen 200 MG tablet  Commonly known as:  ADVIL,MOTRIN  Take 400 mg by mouth every 8 (eight) hours as needed. For pain.     levofloxacin 750 MG tablet  Commonly known as:  LEVAQUIN  Take 1 tablet (750 mg total) by mouth daily.     polyethylene glycol packet  Commonly known as:  MIRALAX  Take 17 g by mouth daily.     temazepam 7.5 MG capsule  Commonly known as:  RESTORIL  Take 1 capsule (7.5 mg total) by mouth at bedtime as needed for sleep.     traMADol 50 MG tablet  Commonly known as:  ULTRAM  Take 1 tablet (50 mg total) by mouth every 6 (six) hours as needed.           Follow-up Information   Follow up with Lorayne Marek, MD. Schedule an appointment as soon as possible for a visit in 1 week. (Follow up appt after recent hospitalization)    Specialty:  Internal Medicine   Contact information:   [address] [address] [phone]        The results of significant diagnostics from this hospitalization (including imaging, microbiology, ancillary and laboratory) are listed below for reference.    Significant Diagnostic Studies: Dg Chest 2 View  05/28/2014   CLINICAL DATA:  Generalized body aches and pain.  EXAM: CHEST  2 VIEW  COMPARISON:  05/15/2014  FINDINGS: Cardiomediastinal contours are within normal range. Mild left hemidiaphragm elevation. No confluent airspace opacity, pleural effusion, or pneumothorax. No acute osseous finding. Surgical clips left upper quadrant.  IMPRESSION: No radiographic evidence of active cardiopulmonary disease.   Electronically Signed   By: Carlos Levering M.D.   On: 05/28/2014 05:48   Dg Chest 2 View  05/15/2014   CLINICAL DATA:  Chest pain  EXAM: CHEST  2 VIEW  COMPARISON:  August 27, 2007   FINDINGS: There is no edema or consolidation. The heart size and pulmonary vascularity are normal. No pneumothorax. No adenopathy. No bone lesions.  IMPRESSION: No edema or consolidation.   Electronically Signed   By: Lowella Grip M.D.   On: 05/15/2014 22:01   Ct Head Wo Contrast  05/28/2014   CLINICAL DATA:  Headache/weakness  EXAM: CT HEAD WITHOUT CONTRAST  TECHNIQUE: Contiguous axial images were obtained from the base of the skull through the vertex without intravenous contrast.  COMPARISON:  Head CT 08/27/2007  FINDINGS: There is no intra or extra-axial fluid collection or mass lesion. The basilar cisterns and ventricles have a normal appearance. There is no CT evidence for acute infarction or hemorrhage. Paranasal sinuses and mastoid air cells are unremarkable. Calvarium intact.  IMPRESSION: No acute intracranial process.   Electronically Signed   By: Lovey Newcomer M.D.   On: 05/28/2014 18:05    Microbiology: Recent Results (from the past 240 hour(s))  TECHNOLOGIST REVIEW     Status: None   Collection Time    05/22/14 12:21 PM      Result Value Ref Range Status   Technologist Review     Final   Value: Occ Large platelets, Sl Polychromasia, and Basophilic stippling, Few Teardrop, Schistocytes, and Target cells, Few neutrophils with vacuoles  CULTURE, BLOOD (ROUTINE X 2)     Status: None   Collection Time    05/28/14  5:23 AM      Result Value Ref Range Status   Specimen Description BLOOD LEFT ANTECUBITAL   Final   Special Requests BOTTLES DRAWN AEROBIC AND ANAEROBIC 5CC   Final   Culture  Setup Time     Final   Value: 05/28/2014 08:41     Performed at [facility]   Culture     Final   Value:        BLOOD CULTURE RECEIVED NO GROWTH TO DATE CULTURE WILL BE HELD FOR 5 DAYS BEFORE ISSUING A FINAL NEGATIVE REPORT     Performed at [facility]   Report Status PENDING   Incomplete  CULTURE, BLOOD (ROUTINE X 2)     Status: None   Collection Time    05/28/14  5:31 AM       Result Value Ref Range Status   Specimen Description BLOOD RIGHT HAND   Final   Special Requests BOTTLES DRAWN AEROBIC AND ANAEROBIC 5CC   Final   Culture  Setup Time     Final   Value: 05/28/2014 08:40     Performed at [facility]   Culture     Final   Value:        BLOOD CULTURE RECEIVED NO GROWTH TO DATE CULTURE WILL BE HELD FOR 5 DAYS BEFORE ISSUING A FINAL NEGATIVE REPORT     Performed at [facility]   Report Status PENDING   Incomplete     Labs: Basic Metabolic Panel:  Recent Labs Lab 05/28/14 0354 05/28/14 0845 05/29/14 0515  NA 137 138 135*  K 4.3 4.6 4.4  CL 101 104 101  CO2 23 22 22   GLUCOSE 104* 160* 97  BUN 7 6 5*  CREATININE 0.69 0.58 0.62  CALCIUM 8.9 8.6 8.6  MG  --  2.0  --   PHOS  --  4.4  --    Liver Function Tests:  Recent Labs Lab 05/28/14 0845 05/29/14 0515  AST 40* 86*  ALT 31 78*  ALKPHOS 56 65  BILITOT 1.2 1.0  PROT 6.7 6.5  ALBUMIN 3.5 3.2*   No results found for this basename: LIPASE, AMYLASE,  in the last 168 hours No results found for this basename: AMMONIA,  in the last 168 hours CBC:  Recent Labs Lab 05/22/14 1221 05/28/14 0354 05/28/14 0845 05/29/14 0515  WBC 8.9 11.0* 10.5 9.9  NEUTROABS 7.4* 9.7* 9.3*  --   HGB 8.8* 9.6* 9.2* 8.8*  HCT 30.8* 31.8* 31.6* 30.5*  MCV 76.4* 75.0* 77.3* 77.8*  PLT 588* 708* 675* 487*  Cardiac Enzymes: No results found for this basename: CKTOTAL, CKMB, CKMBINDEX, TROPONINI,  in the last 168 hours BNP: BNP (last 3 results) No results found for this basename: PROBNP,  in the last 8760 hours CBG: No results found for this basename: GLUCAP,  in the last 168 hours  Time coordinating discharge: Over 30 minutes

## 2014-05-29 NOTE — Progress Notes (Signed)
Patient d/c home. Stable.- Jecenia Leamer RN 

## 2014-05-29 NOTE — Progress Notes (Signed)
Patient d/c instructions given, verbalized understanding, teach back uitilized, denies pain. Afebrile. Stable for d/c.- Sandie Ano RN

## 2014-05-30 LAB — URINE CULTURE
CULTURE: NO GROWTH
Colony Count: NO GROWTH

## 2014-06-02 ENCOUNTER — Telehealth: Payer: Self-pay | Admitting: *Deleted

## 2014-06-02 NOTE — Telephone Encounter (Signed)
Patient left message that she has a lesion on her vagina that is burning. She reported that she has an area on her vulva that is burning it looks like a paper cut to her it is swollen as well. She also has a white area that burns near her anus. Advised patient that I am unable to diagnose over the phone and she should go to [facility] for evaluation if she desires or she can see her PCP for this as well. Patient is agreeable to this.

## 2014-06-03 LAB — CULTURE, BLOOD (ROUTINE X 2)
Culture: NO GROWTH
Culture: NO GROWTH

## 2014-06-11 ENCOUNTER — Other Ambulatory Visit: Payer: Self-pay

## 2014-06-15 ENCOUNTER — Ambulatory Visit: Payer: Self-pay | Admitting: Hematology and Oncology

## 2014-07-27 ENCOUNTER — Encounter (HOSPITAL_COMMUNITY): Payer: Self-pay | Admitting: Emergency Medicine

## 2014-08-05 ENCOUNTER — Other Ambulatory Visit: Payer: Self-pay | Admitting: Hematology and Oncology

## 2014-08-24 ENCOUNTER — Other Ambulatory Visit (HOSPITAL_BASED_OUTPATIENT_CLINIC_OR_DEPARTMENT_OTHER): Payer: Self-pay

## 2014-08-24 ENCOUNTER — Telehealth: Payer: Self-pay | Admitting: *Deleted

## 2014-08-24 ENCOUNTER — Telehealth: Payer: Self-pay | Admitting: Hematology and Oncology

## 2014-08-24 DIAGNOSIS — D509 Iron deficiency anemia, unspecified: Secondary | ICD-10-CM

## 2014-08-24 DIAGNOSIS — E559 Vitamin D deficiency, unspecified: Secondary | ICD-10-CM

## 2014-08-24 LAB — CBC & DIFF AND RETIC
BASO%: 0.2 % (ref 0.0–2.0)
Basophils Absolute: 0 10*3/uL (ref 0.0–0.1)
EOS%: 2.1 % (ref 0.0–7.0)
Eosinophils Absolute: 0.1 10*3/uL (ref 0.0–0.5)
HCT: 37.8 % (ref 34.8–46.6)
HGB: 11.9 g/dL (ref 11.6–15.9)
Immature Retic Fract: 3.3 % (ref 1.60–10.00)
LYMPH%: 55.6 % — ABNORMAL HIGH (ref 14.0–49.7)
MCH: 24.4 pg — ABNORMAL LOW (ref 25.1–34.0)
MCHC: 31.5 g/dL (ref 31.5–36.0)
MCV: 77.6 fL — AB (ref 79.5–101.0)
MONO#: 0.2 10*3/uL (ref 0.1–0.9)
MONO%: 4.9 % (ref 0.0–14.0)
NEUT%: 37.2 % — ABNORMAL LOW (ref 38.4–76.8)
NEUTROS ABS: 1.6 10*3/uL (ref 1.5–6.5)
Platelets: 310 10*3/uL (ref 145–400)
RBC: 4.87 10*6/uL (ref 3.70–5.45)
RDW: 15.8 % — ABNORMAL HIGH (ref 11.2–14.5)
Retic %: 0.73 % (ref 0.70–2.10)
Retic Ct Abs: 35.55 10*3/uL (ref 33.70–90.70)
WBC: 4.3 10*3/uL (ref 3.9–10.3)
lymph#: 2.4 10*3/uL (ref 0.9–3.3)
nRBC: 0 % (ref 0–0)

## 2014-08-24 LAB — FERRITIN CHCC: Ferritin: 21 ng/ml (ref 9–269)

## 2014-08-24 LAB — HOLD TUBE, BLOOD BANK

## 2014-08-24 NOTE — Telephone Encounter (Signed)
pt called to r/s appt to sooner time to explain info from another MD..done....pt aware of new d.t

## 2014-08-24 NOTE — Telephone Encounter (Signed)
Walk in form time = 11:35 am "Wanted to discuss recent hair loss, break outs and splitting nails." 11:45 This nurse assessing EMR and awaiting lab results. 12:08 noted cbc results wnl and to lobby to give cbc results and talk with patient.  Called patient's name in three different areas of lobby with no response. 12:58 to call patient and note she has called and rescheduled F/U from 08-31-2014 to 08-27-2014.  No call to patient.

## 2014-08-25 ENCOUNTER — Other Ambulatory Visit: Payer: Self-pay | Admitting: Hematology and Oncology

## 2014-08-25 ENCOUNTER — Telehealth: Payer: Self-pay | Admitting: *Deleted

## 2014-08-25 ENCOUNTER — Other Ambulatory Visit: Payer: Self-pay | Admitting: *Deleted

## 2014-08-25 LAB — VITAMIN D 25 HYDROXY (VIT D DEFICIENCY, FRACTURES): Vit D, 25-Hydroxy: 7 ng/mL — ABNORMAL LOW (ref 30–100)

## 2014-08-25 NOTE — Telephone Encounter (Signed)
Per staff message and POF I have scheduled appts. Advised scheduler of appts. JMW  

## 2014-08-27 ENCOUNTER — Telehealth: Payer: Self-pay | Admitting: Hematology and Oncology

## 2014-08-27 ENCOUNTER — Ambulatory Visit: Payer: Self-pay

## 2014-08-27 ENCOUNTER — Ambulatory Visit (HOSPITAL_BASED_OUTPATIENT_CLINIC_OR_DEPARTMENT_OTHER): Payer: Self-pay | Admitting: Hematology and Oncology

## 2014-08-27 ENCOUNTER — Encounter: Payer: Self-pay | Admitting: Hematology and Oncology

## 2014-08-27 ENCOUNTER — Telehealth: Payer: Self-pay | Admitting: *Deleted

## 2014-08-27 VITALS — BP 116/66 | HR 78 | Temp 98.2°F | Resp 18 | Ht 66.0 in | Wt 162.5 lb

## 2014-08-27 DIAGNOSIS — D519 Vitamin B12 deficiency anemia, unspecified: Secondary | ICD-10-CM

## 2014-08-27 DIAGNOSIS — D509 Iron deficiency anemia, unspecified: Secondary | ICD-10-CM

## 2014-08-27 DIAGNOSIS — E559 Vitamin D deficiency, unspecified: Secondary | ICD-10-CM

## 2014-08-27 MED ORDER — ERGOCALCIFEROL 1.25 MG (50000 UT) PO CAPS: 50000.0000 [IU] | ORAL_CAPSULE | ORAL | Status: DC

## 2014-08-27 NOTE — Progress Notes (Signed)
[place] OFFICE PROGRESS NOTE  ADVANI, Haley Prey, MD SUMMARY OF HEMATOLOGIC HISTORY:  She was found to have abnormal CBC from recent blood work from her primary care physician's office. The total white blood cell count was 4.3, hemoglobin 6.4, MCV of 65.3 and platelet count of 73,000. She also has severe vitamin D deficiency, less than 10. She denies chest pain but she has severe shortness of breath on minimal exertion, pre-syncopal episodes, palpitations leg cramps and restless leg syndrome. She has history of gastric bypass surgery 9 what years ago and has lost 110 pounds of weight. She new of prior history of mineral deficiency is but were not taking any form of vitamin replacement therapy consistently. She never donated blood. She had received blood transfusion in the past during her gastric bypass surgery. The patient was prescribed oral iron supplements but she is not taking it due to severe diarrhea with oral iron supplement The patient also complained of insomnia. She is constipated for one week. She received one unit of blood and IV iron along with vitamin B12 injection on 05/14/2014. On 05/15/2014, she received another unit of blood transfusion. INTERVAL HISTORY: Haley Martinez 33 y.o. female returns for further follow-up. She complained of persistent fatigue. The patient denies any recent signs or symptoms of bleeding such as spontaneous epistaxis, hematuria or hematochezia. She claimed that she is taking high-dose oral vitamin B-12, vitamin D and iron supplement. She started to have hair loss.  I have reviewed the past medical history, past surgical history, social history and family history with the patient and they are unchanged from previous note.  ALLERGIES:  is allergic to trazodone and nefazodone.  MEDICATIONS:  Current Outpatient Prescriptions  Medication Sig Dispense Refill  . butalbital-acetaminophen-caffeine (FIORICET) 50-325-40 MG per tablet Take 1 tablet  by mouth every 6 (six) hours as needed for headache. 90 tablet 0  . ergocalciferol (VITAMIN D2) 50000 UNITS capsule Take 1 capsule (50,000 Units total) by mouth once a week. 4 capsule 12   No current facility-administered medications for this visit.     REVIEW OF SYSTEMS:   Constitutional: Denies fevers, chills or night sweats Eyes: Denies blurriness of vision Ears, nose, mouth, throat, and face: Denies mucositis or sore throat Respiratory: Denies cough, dyspnea or wheezes Cardiovascular: Denies palpitation, chest discomfort or lower extremity swelling Gastrointestinal:  Denies nausea, heartburn or change in bowel habits Skin: Denies abnormal skin rashes Lymphatics: Denies new lymphadenopathy or easy bruising Neurological:Denies numbness, tingling or new weaknesses Behavioral/Psych: Mood is stable, no new changes  All other systems were reviewed with the patient and are negative.  PHYSICAL EXAMINATION: ECOG PERFORMANCE STATUS: 0 - Asymptomatic  Filed Vitals:   08/27/14 1305  BP: 116/66  Pulse: 78  Temp: 98.2 F (36.8 C)  Resp: 18   Filed Weights   08/27/14 1305  Weight: 162 lb 8 oz (73.71 kg)    GENERAL:alert, no distress and comfortable SKIN: skin color, texture, turgor are normal, no rashes or significant lesions. Noted mild acne EYES: normal, Conjunctiva are pink and non-injected, sclera clear OROPHARYNX:no exudate, no erythema and lips, buccal mucosa, and tongue normal  NECK: supple, thyroid normal size, non-tender, without nodularity LYMPH:  no palpable lymphadenopathy in the cervical, axillary or inguinal LUNGS: clear to auscultation and percussion with normal breathing effort HEART: regular rate & rhythm and no murmurs and no lower extremity edema ABDOMEN:abdomen soft, non-tender and normal bowel sounds Musculoskeletal:no cyanosis of digits and no clubbing  NEURO: alert & oriented  x 3 with fluent speech, no focal motor/sensory deficits  LABORATORY DATA:  I have  reviewed the data as listed No results found for this or any previous visit (from the past 48 hour(s)).  Lab Results  Component Value Date   WBC 4.3 08/24/2014   HGB 11.9 08/24/2014   HCT 37.8 08/24/2014   MCV 77.6* 08/24/2014   PLT 310 08/24/2014    ASSESSMENT & PLAN:  Iron deficiency anemia She had received recent blood transfusion and iron infusion. The serum ferritin is low and she is symptomatic. The most likely cause of her anemia is due to chronic blood loss/malabsorption syndrome. We discussed some of the risks, benefits, and alternatives of intravenous iron infusions. The patient is symptomatic from anemia and the iron level is critically low. She tolerated oral iron supplement poorly and desires to achieved higher levels of iron faster for adequate hematopoesis. Some of the side-effects to be expected including risks of infusion reactions, phlebitis, headaches, nausea and fatigue.  The patient is willing to proceed. Patient education material was dispensed.  Goal is to keep ferritin level greater than 50.  We'll proceed with iron infusion tomorrow.  I will see her back in 3 months with repeat blood work prior to her return visit. The patient will likely need iron replacement therapy in the future.    B12 deficiency anemia She had received multiple vitamin B12 injections. I recommend high dose vitamin B12 replacement therapy.  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 I recommend prescription strength high dose vitamin D replacement with plan to recheck again in 3 months.   All questions were answered. The patient knows to call the clinic with any problems, questions or concerns. No barriers to learning was detected.  I spent 25 minutes counseling the patient face to face. The total time spent in the appointment was 30 minutes and more than 50% was on counseling.     [facility], Issam Carlyon, MD 08/27/2014 8:50 PM

## 2014-08-27 NOTE — Telephone Encounter (Signed)
Pt confirmed labs/ov per 12/03 POF, gave pt AVS... KJ °

## 2014-08-27 NOTE — Assessment & Plan Note (Signed)
This is due to malabsorption. I recommend prescription strength high dose vitamin D replacement with plan to recheck again in 3 months.

## 2014-08-27 NOTE — Telephone Encounter (Signed)
Per staff message and POF I have scheduled appts. Advised scheduler of appts. JMW  

## 2014-08-27 NOTE — Telephone Encounter (Signed)
Pt confirmed labs/ov per 12/03, gave pt AVS.... KJ sent msg to add IV Iron for March, spk with Sharyn Lull and added IV Iron for 12/04 and left msg for pt confirming time...Marland KitchenMarland Kitchen

## 2014-08-27 NOTE — Assessment & Plan Note (Signed)
She had received multiple vitamin B12 injections. I recommend high dose vitamin B12 replacement therapy.

## 2014-08-27 NOTE — Assessment & Plan Note (Signed)
She had received recent blood transfusion and iron infusion. The serum ferritin is low and she is symptomatic. The most likely cause of her anemia is due to chronic blood loss/malabsorption syndrome. We discussed some of the risks, benefits, and alternatives of intravenous iron infusions. The patient is symptomatic from anemia and the iron level is critically low. She tolerated oral iron supplement poorly and desires to achieved higher levels of iron faster for adequate hematopoesis. Some of the side-effects to be expected including risks of infusion reactions, phlebitis, headaches, nausea and fatigue.  The patient is willing to proceed. Patient education material was dispensed.  Goal is to keep ferritin level greater than 50.  We'll proceed with iron infusion tomorrow.  I will see her back in 3 months with repeat blood work prior to her return visit. The patient will likely need iron replacement therapy in the future.

## 2014-08-28 ENCOUNTER — Other Ambulatory Visit: Payer: Self-pay | Admitting: Hematology and Oncology

## 2014-08-28 ENCOUNTER — Ambulatory Visit (HOSPITAL_BASED_OUTPATIENT_CLINIC_OR_DEPARTMENT_OTHER): Payer: Self-pay

## 2014-08-28 DIAGNOSIS — D509 Iron deficiency anemia, unspecified: Secondary | ICD-10-CM

## 2014-08-28 DIAGNOSIS — Z9884 Bariatric surgery status: Secondary | ICD-10-CM

## 2014-08-28 DIAGNOSIS — D519 Vitamin B12 deficiency anemia, unspecified: Secondary | ICD-10-CM

## 2014-08-28 MED ORDER — SODIUM CHLORIDE 0.9 % IV SOLN
1020.0000 mg | Freq: Once | INTRAVENOUS | Status: AC
  Administered 2014-08-28: 1020 mg via INTRAVENOUS
  Filled 2014-08-28: qty 34

## 2014-08-28 MED ORDER — SODIUM CHLORIDE 0.9 % IV SOLN: Freq: Once | INTRAVENOUS | Status: DC

## 2014-08-28 NOTE — Patient Instructions (Signed)

## 2014-08-31 ENCOUNTER — Ambulatory Visit: Payer: Self-pay | Admitting: Hematology and Oncology

## 2014-10-06 ENCOUNTER — Telehealth: Payer: Self-pay | Admitting: Hematology and Oncology

## 2014-10-06 ENCOUNTER — Other Ambulatory Visit: Payer: Self-pay | Admitting: Hematology and Oncology

## 2014-10-06 ENCOUNTER — Telehealth: Payer: Self-pay | Admitting: *Deleted

## 2014-10-06 NOTE — Telephone Encounter (Signed)
I'm sorry but earliest appt is 1/29. We can schedule labs today or tomorrow and if he labs are significantly different we can move her appt sooner.

## 2014-10-06 NOTE — Telephone Encounter (Signed)
, °

## 2014-10-06 NOTE — Telephone Encounter (Signed)
Left VM for pt informing her of Dr. Calton Dach message.  Asked her to call back to schedule lab appt.Marland Kitchen

## 2014-10-06 NOTE — Telephone Encounter (Signed)
returned pt call and s/w....fwd to desk nurse

## 2014-10-06 NOTE — Telephone Encounter (Signed)
Pt states very concerned about her overall health and has called Scheduler to move her appt w/ Dr. Alvy Bimler up as soon as possible.  Pt was informed of next available appt is at the end of this month.  She asks if she can be seen any sooner because her hair is coming out "by the handfuls" and she is almost completely bald.   Asked pt if she has contacted her PCP for appt?  She says she is trying but no one will call her back.  She says she is also concerned about her P.C.O.S and thinks this needs to "be checked."   Instructed pt to contact her GYN regarding PCOS.  Explained to pt that Dr. Alvy Bimler can help manage her anemia, but the PCOS and hair loss are not really part of her expertise.  Pt continued to say she just doesn't feel good in general and would appreciate seeing Dr. Alvy Bimler as soon as possible.

## 2014-10-07 ENCOUNTER — Telehealth: Payer: Self-pay | Admitting: Hematology and Oncology

## 2014-10-07 NOTE — Telephone Encounter (Signed)
returned call and lvm for pt regarding to Jan appt.Haley KitchenMarland Martinez

## 2014-10-08 ENCOUNTER — Other Ambulatory Visit (HOSPITAL_BASED_OUTPATIENT_CLINIC_OR_DEPARTMENT_OTHER): Payer: Self-pay

## 2014-10-08 DIAGNOSIS — D519 Vitamin B12 deficiency anemia, unspecified: Secondary | ICD-10-CM

## 2014-10-08 DIAGNOSIS — D509 Iron deficiency anemia, unspecified: Secondary | ICD-10-CM

## 2014-10-08 DIAGNOSIS — E559 Vitamin D deficiency, unspecified: Secondary | ICD-10-CM

## 2014-10-08 LAB — IRON AND TIBC CHCC
%SAT: 52 % (ref 21–57)
IRON: 117 ug/dL (ref 41–142)
TIBC: 224 ug/dL — ABNORMAL LOW (ref 236–444)
UIBC: 106 ug/dL — AB (ref 120–384)

## 2014-10-08 LAB — CBC & DIFF AND RETIC
BASO%: 0.3 % (ref 0.0–2.0)
BASOS ABS: 0 10*3/uL (ref 0.0–0.1)
EOS ABS: 0.1 10*3/uL (ref 0.0–0.5)
EOS%: 2 % (ref 0.0–7.0)
HEMATOCRIT: 36.5 % (ref 34.8–46.6)
HGB: 11.5 g/dL — ABNORMAL LOW (ref 11.6–15.9)
Immature Retic Fract: 5 % (ref 1.60–10.00)
LYMPH#: 1.2 10*3/uL (ref 0.9–3.3)
LYMPH%: 40.1 % (ref 14.0–49.7)
MCH: 24.5 pg — ABNORMAL LOW (ref 25.1–34.0)
MCHC: 31.5 g/dL (ref 31.5–36.0)
MCV: 77.8 fL — AB (ref 79.5–101.0)
MONO#: 0.1 10*3/uL (ref 0.1–0.9)
MONO%: 2.9 % (ref 0.0–14.0)
NEUT#: 1.7 10*3/uL (ref 1.5–6.5)
NEUT%: 54.7 % (ref 38.4–76.8)
Platelets: 241 10*3/uL (ref 145–400)
RBC: 4.69 10*6/uL (ref 3.70–5.45)
RDW: 15.9 % — ABNORMAL HIGH (ref 11.2–14.5)
RETIC CT ABS: 61.91 10*3/uL (ref 33.70–90.70)
Retic %: 1.32 % (ref 0.70–2.10)
WBC: 3.1 10*3/uL — ABNORMAL LOW (ref 3.9–10.3)

## 2014-10-08 LAB — VITAMIN D 25 HYDROXY (VIT D DEFICIENCY, FRACTURES): VIT D 25 HYDROXY: 9 ng/mL — AB (ref 30–100)

## 2014-10-08 LAB — FERRITIN CHCC: FERRITIN: 284 ng/mL — AB (ref 9–269)

## 2014-10-08 LAB — VITAMIN B12: Vitamin B-12: 247 pg/mL (ref 211–911)

## 2014-10-16 ENCOUNTER — Ambulatory Visit: Payer: Self-pay | Admitting: Internal Medicine

## 2014-10-22 ENCOUNTER — Encounter: Payer: Self-pay | Admitting: Internal Medicine

## 2014-10-22 ENCOUNTER — Ambulatory Visit: Payer: Self-pay | Attending: Internal Medicine | Admitting: Internal Medicine

## 2014-10-22 VITALS — BP 134/88 | HR 76 | Temp 98.0°F | Resp 16 | Wt 173.4 lb

## 2014-10-22 DIAGNOSIS — L659 Nonscarring hair loss, unspecified: Secondary | ICD-10-CM | POA: Insufficient documentation

## 2014-10-22 DIAGNOSIS — R519 Headache, unspecified: Secondary | ICD-10-CM

## 2014-10-22 DIAGNOSIS — Z8742 Personal history of other diseases of the female genital tract: Secondary | ICD-10-CM

## 2014-10-22 DIAGNOSIS — N921 Excessive and frequent menstruation with irregular cycle: Secondary | ICD-10-CM

## 2014-10-22 DIAGNOSIS — N920 Excessive and frequent menstruation with regular cycle: Secondary | ICD-10-CM | POA: Insufficient documentation

## 2014-10-22 DIAGNOSIS — G4700 Insomnia, unspecified: Secondary | ICD-10-CM | POA: Insufficient documentation

## 2014-10-22 DIAGNOSIS — R51 Headache: Secondary | ICD-10-CM | POA: Insufficient documentation

## 2014-10-22 DIAGNOSIS — E282 Polycystic ovarian syndrome: Secondary | ICD-10-CM | POA: Insufficient documentation

## 2014-10-22 MED ORDER — AMITRIPTYLINE HCL 75 MG PO TABS: 75.0000 mg | ORAL_TABLET | Freq: Every day | ORAL | Status: DC

## 2014-10-22 NOTE — Progress Notes (Signed)
Patient complains of having hair loss for the last two months Her physician at the cancer center increased her vitamin D Patient has a history of PCOS and googled  Information and saw that hair Loss can be a symptom as well

## 2014-10-22 NOTE — Progress Notes (Signed)
MRN: [id number] Name: Haley Martinez  Sex: female Age: 34 y.o. DOB: 05/26/1981  Allergies: Trazodone and nefazodone  Chief Complaint  Patient presents with  . Alopecia    HPI: Patient is 34 y.o. female who history of gastric bypass, iron deficiency anemia, menorrhagia, vitamin B 12 deficiency vitamin D deficiency, comes today complaining of excessive hair loss, currently following up with hematology and is undergoing blood transfusion as well as iron infusions. Patient also reported to have on and off headache as well as problem with the sleep, she tried trazodone in the past without much improvement.patient also reports having menstrual periods.   Past Medical History  Diagnosis Date  . Polycystic ovarian syndrome   . Anemia   . Insomnia   . Restless leg syndrome   . Constipation 05/14/2014  . Headache 05/22/2014    Past Surgical History  Procedure Laterality Date  . Gastric bypass        Medication List       This list is accurate as of: 10/22/14  6:12 PM.  Always use your most recent med list.               amitriptyline 75 MG tablet  Commonly known as:  ELAVIL  Take 1 tablet (75 mg total) by mouth at bedtime.     butalbital-acetaminophen-caffeine 50-325-40 MG per tablet  Commonly known as:  FIORICET  Take 1 tablet by mouth every 6 (six) hours as needed for headache.     ergocalciferol 50000 UNITS capsule  Commonly known as:  VITAMIN D2  Take 1 capsule (50,000 Units total) by mouth once a week.        Meds ordered this encounter  Medications  . amitriptyline (ELAVIL) 75 MG tablet    Sig: Take 1 tablet (75 mg total) by mouth at bedtime.    Dispense:  30 tablet    Refill:  3     There is no immunization history on file for this patient.  Family History  Problem Relation Age of Onset  . Heart disease Mother   . Hyperlipidemia Mother   . Hypertension Mother   . Hyperlipidemia Father   . Hypertension Father   . Cancer Maternal Uncle    pancreatic ca  . Drug abuse Maternal Grandmother   . Drug abuse Paternal Grandmother   . Stroke Paternal Grandfather     History  Substance Use Topics  . Smoking status: Never Smoker   . Smokeless tobacco: Never Used  . Alcohol Use: Yes     Comment: occasionally    Review of Systems   As noted in HPI  Filed Vitals:   10/22/14 1700  BP: 134/88  Pulse: 76  Temp: 98 F (36.7 C)  Resp: 16    Physical Exam  Physical Exam  Constitutional: No distress.  HENT:  Hair thinning / significant hair loss   Eyes: EOM are normal. Pupils are equal, round, and reactive to light.  Cardiovascular: Normal rate and regular rhythm.   Pulmonary/Chest: Breath sounds normal. No respiratory distress. She has no wheezes. She has no rales.  Abdominal: Soft. There is no tenderness.  Musculoskeletal: She exhibits no edema.    CBC    Component Value Date/Time   WBC 3.1* 10/08/2014 0945   WBC 9.9 05/29/2014 0515   RBC 4.69 10/08/2014 0945   RBC 3.92 05/29/2014 0515   HGB 11.5* 10/08/2014 0945   HGB 8.8* 05/29/2014 0515   HCT 36.5 10/08/2014 0945  HCT 30.5* 05/29/2014 0515   PLT 241 10/08/2014 0945   PLT 487* 05/29/2014 0515   MCV 77.8* 10/08/2014 0945   MCV 77.8* 05/29/2014 0515   LYMPHSABS 1.2 10/08/2014 0945   LYMPHSABS 0.7 05/28/2014 0845   MONOABS 0.1 10/08/2014 0945   MONOABS 0.5 05/28/2014 0845   EOSABS 0.1 10/08/2014 0945   EOSABS 0.0 05/28/2014 0845   BASOSABS 0.0 10/08/2014 0945   BASOSABS 0.0 05/28/2014 0845    CMP     Component Value Date/Time   NA 135* 05/29/2014 0515   NA 136 05/15/2014 1310   K 4.4 05/29/2014 0515   K 3.9 05/15/2014 1310   CL 101 05/29/2014 0515   CO2 22 05/29/2014 0515   CO2 24 05/15/2014 1310   GLUCOSE 97 05/29/2014 0515   GLUCOSE 178* 05/15/2014 1310   BUN 5* 05/29/2014 0515   BUN 5.8* 05/15/2014 1310   CREATININE 0.62 05/29/2014 0515   CREATININE 0.8 05/15/2014 1310   CREATININE 0.61 05/12/2014 1601   CALCIUM 8.6 05/29/2014 0515    CALCIUM 9.0 05/15/2014 1310   PROT 6.5 05/29/2014 0515   PROT 7.0 05/15/2014 1310   ALBUMIN 3.2* 05/29/2014 0515   ALBUMIN 3.9 05/15/2014 1310   AST 86* 05/29/2014 0515   AST 38* 05/15/2014 1310   ALT 78* 05/29/2014 0515   ALT 31 05/15/2014 1310   ALKPHOS 65 05/29/2014 0515   ALKPHOS 67 05/15/2014 1310   BILITOT 1.0 05/29/2014 0515   BILITOT 1.10 05/15/2014 1310   GFRNONAA >90 05/29/2014 0515   GFRNONAA >89 05/12/2014 1601   GFRAA >90 05/29/2014 0515   GFRAA >89 05/12/2014 1601    Lab Results  Component Value Date/Time   CHOL 179 05/12/2014 04:01 PM    No components found for: HGA1C  Lab Results  Component Value Date/Time   AST 86* 05/29/2014 05:15 AM   AST 38* 05/15/2014 01:10 PM    Assessment and Plan  History of PCOS - Plan: ordered US Pelvis Complete  Alopecia - Plan: likely multifactorial including nutritional deficiencies ,  iron deficiency vitamin D, vitamin B12 deficiency, her TSH level was normal, Ambulatory referral to Dermatology  Menorrhagia with irregular cycle - Plan: US Pelvis Complete  Headache, unspecified headache type - Plan: trial of amitriptyline (ELAVIL) 75 MG tablet  Insomnia - Plan:trial of amitriptyline (ELAVIL) 75 MG tablet    Return in about 3 months (around 01/21/2015), or if symptoms worsen or fail to improve.  Lorayne Marek, MD

## 2014-10-23 ENCOUNTER — Telehealth: Payer: Self-pay

## 2014-10-23 ENCOUNTER — Telehealth: Payer: Self-pay | Admitting: *Deleted

## 2014-10-23 ENCOUNTER — Other Ambulatory Visit: Payer: Self-pay | Admitting: Internal Medicine

## 2014-10-23 ENCOUNTER — Ambulatory Visit: Payer: Self-pay | Admitting: Hematology and Oncology

## 2014-10-23 DIAGNOSIS — R109 Unspecified abdominal pain: Secondary | ICD-10-CM

## 2014-10-23 NOTE — Telephone Encounter (Signed)
Called patient and left message on voice mail  [place] appointment scheduled for Friday 10/30/14 @ 9am Patient will need to have a full bladder

## 2014-10-23 NOTE — Telephone Encounter (Signed)
Left VM for pt regarding she did not show up for appts this morning.   Per Dr. Alvy Bimler, pt can return on next scheduled visit in March.  Left VM informing pt of lab and MD visits in March and to please call back if any questions.

## 2014-10-30 ENCOUNTER — Ambulatory Visit (HOSPITAL_COMMUNITY): Payer: Self-pay

## 2014-10-30 ENCOUNTER — Ambulatory Visit (HOSPITAL_COMMUNITY)
Admission: RE | Admit: 2014-10-30 | Discharge: 2014-10-30 | Disposition: A | Discharge status: Home / Self Care | Payer: Self-pay | Source: Ambulatory Visit | Attending: Internal Medicine | Admitting: Internal Medicine

## 2014-10-30 ENCOUNTER — Other Ambulatory Visit (HOSPITAL_COMMUNITY): Payer: Self-pay

## 2014-10-30 DIAGNOSIS — Z8742 Personal history of other diseases of the female genital tract: Secondary | ICD-10-CM | POA: Insufficient documentation

## 2014-10-30 DIAGNOSIS — N921 Excessive and frequent menstruation with irregular cycle: Secondary | ICD-10-CM | POA: Insufficient documentation

## 2014-10-30 DIAGNOSIS — R109 Unspecified abdominal pain: Secondary | ICD-10-CM

## 2014-11-02 ENCOUNTER — Telehealth: Payer: Self-pay

## 2014-11-02 NOTE — Telephone Encounter (Signed)
Patient is aware of her ultra sound results

## 2014-11-02 NOTE — Telephone Encounter (Signed)
-----   Message from Lorayne Marek, MD sent at 10/30/2014  1:43 PM EST ----- Call and let the patient know that her pelvic ultrasound reported fibroid, advise patient to follow up  with her gynecologist

## 2014-11-27 ENCOUNTER — Other Ambulatory Visit: Payer: Self-pay | Admitting: Hematology and Oncology

## 2014-11-27 DIAGNOSIS — D509 Iron deficiency anemia, unspecified: Secondary | ICD-10-CM

## 2014-11-27 DIAGNOSIS — E559 Vitamin D deficiency, unspecified: Secondary | ICD-10-CM

## 2014-11-30 ENCOUNTER — Other Ambulatory Visit: Payer: Self-pay

## 2014-12-07 ENCOUNTER — Ambulatory Visit: Payer: Self-pay

## 2014-12-07 ENCOUNTER — Ambulatory Visit: Payer: Self-pay | Admitting: Hematology and Oncology

## 2015-06-24 NOTE — Telephone Encounter (Signed)
error 

## 2015-06-29 IMAGING — CR DG CHEST 2V
2 series · 2 of 2 positions shown · non-contrast
Comparison: August 27, 2007

CLINICAL DATA: Chest pain

EXAM:
CHEST  2 VIEW

[w chest pa]
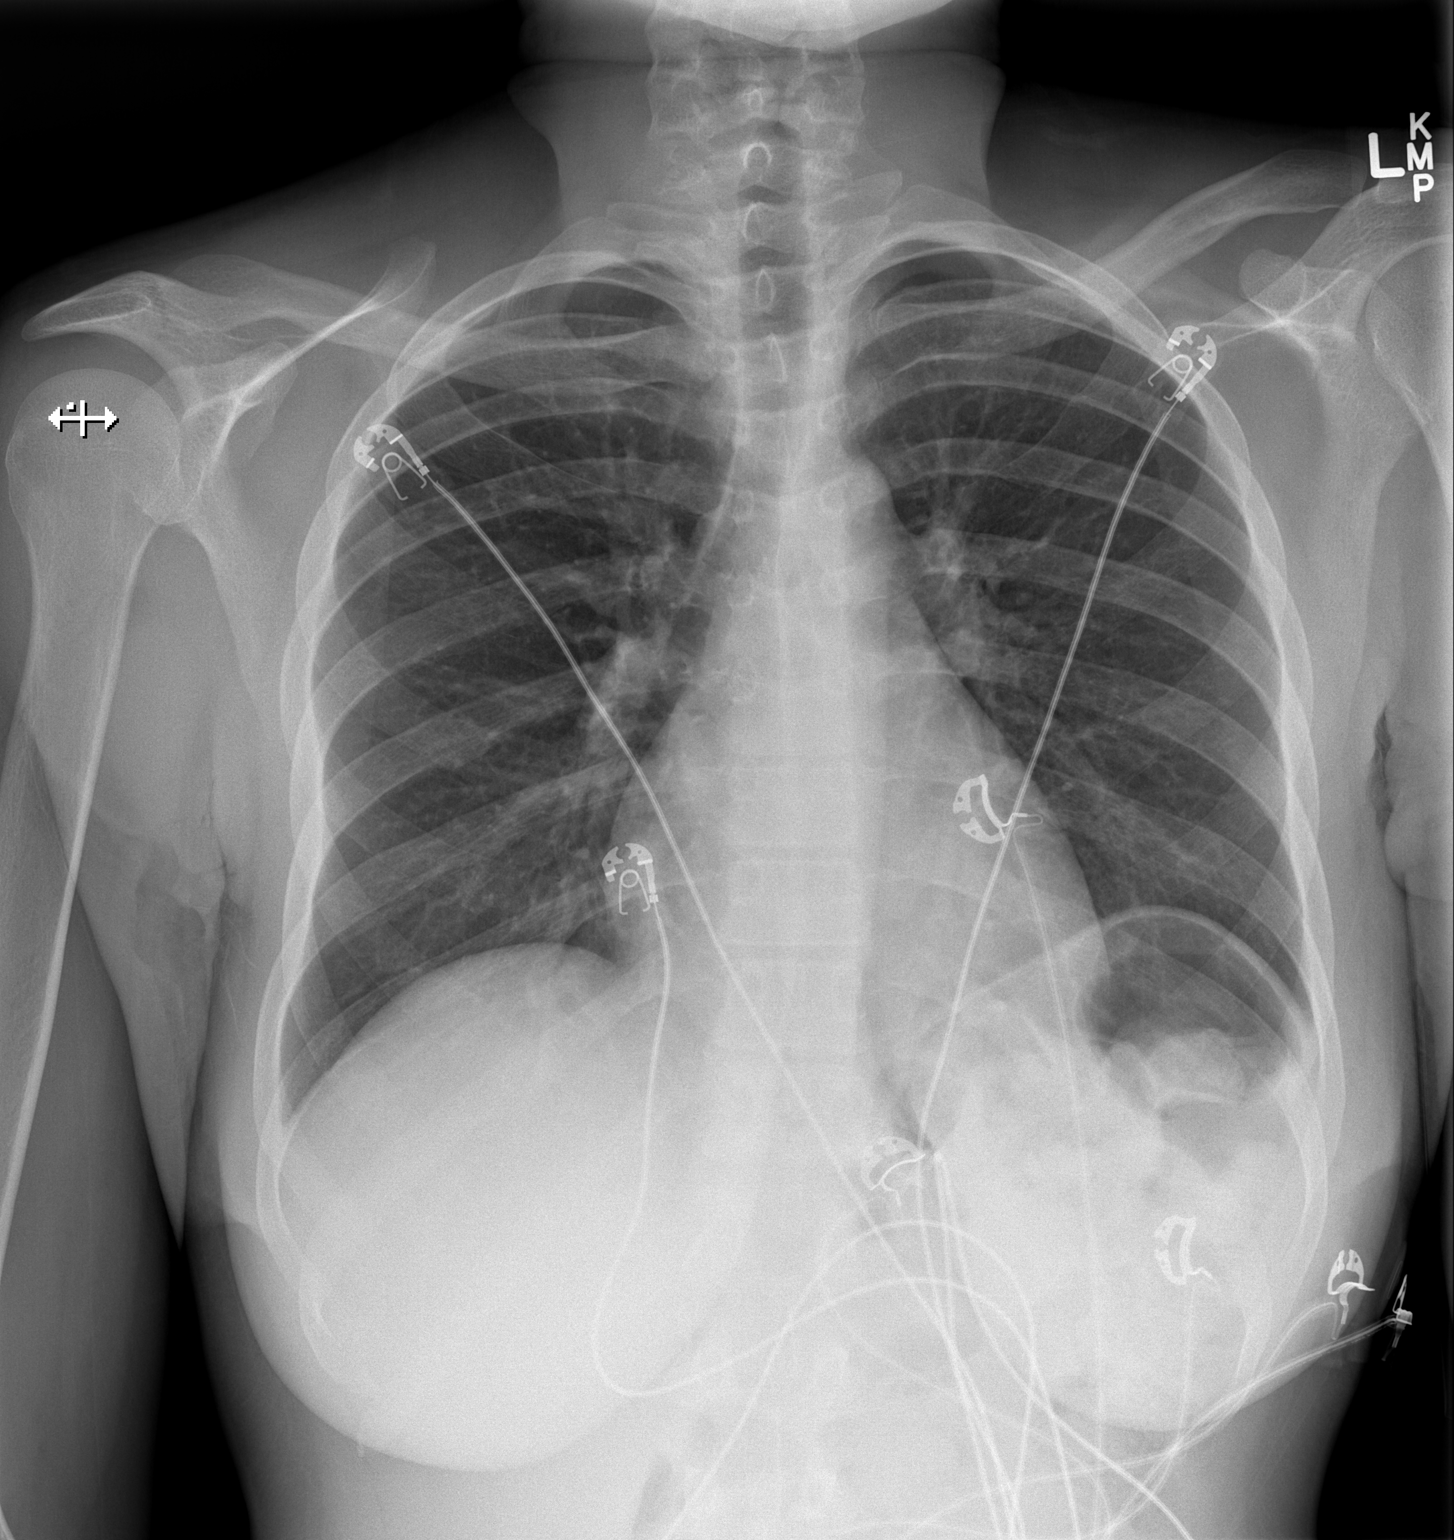

[w chest lat]
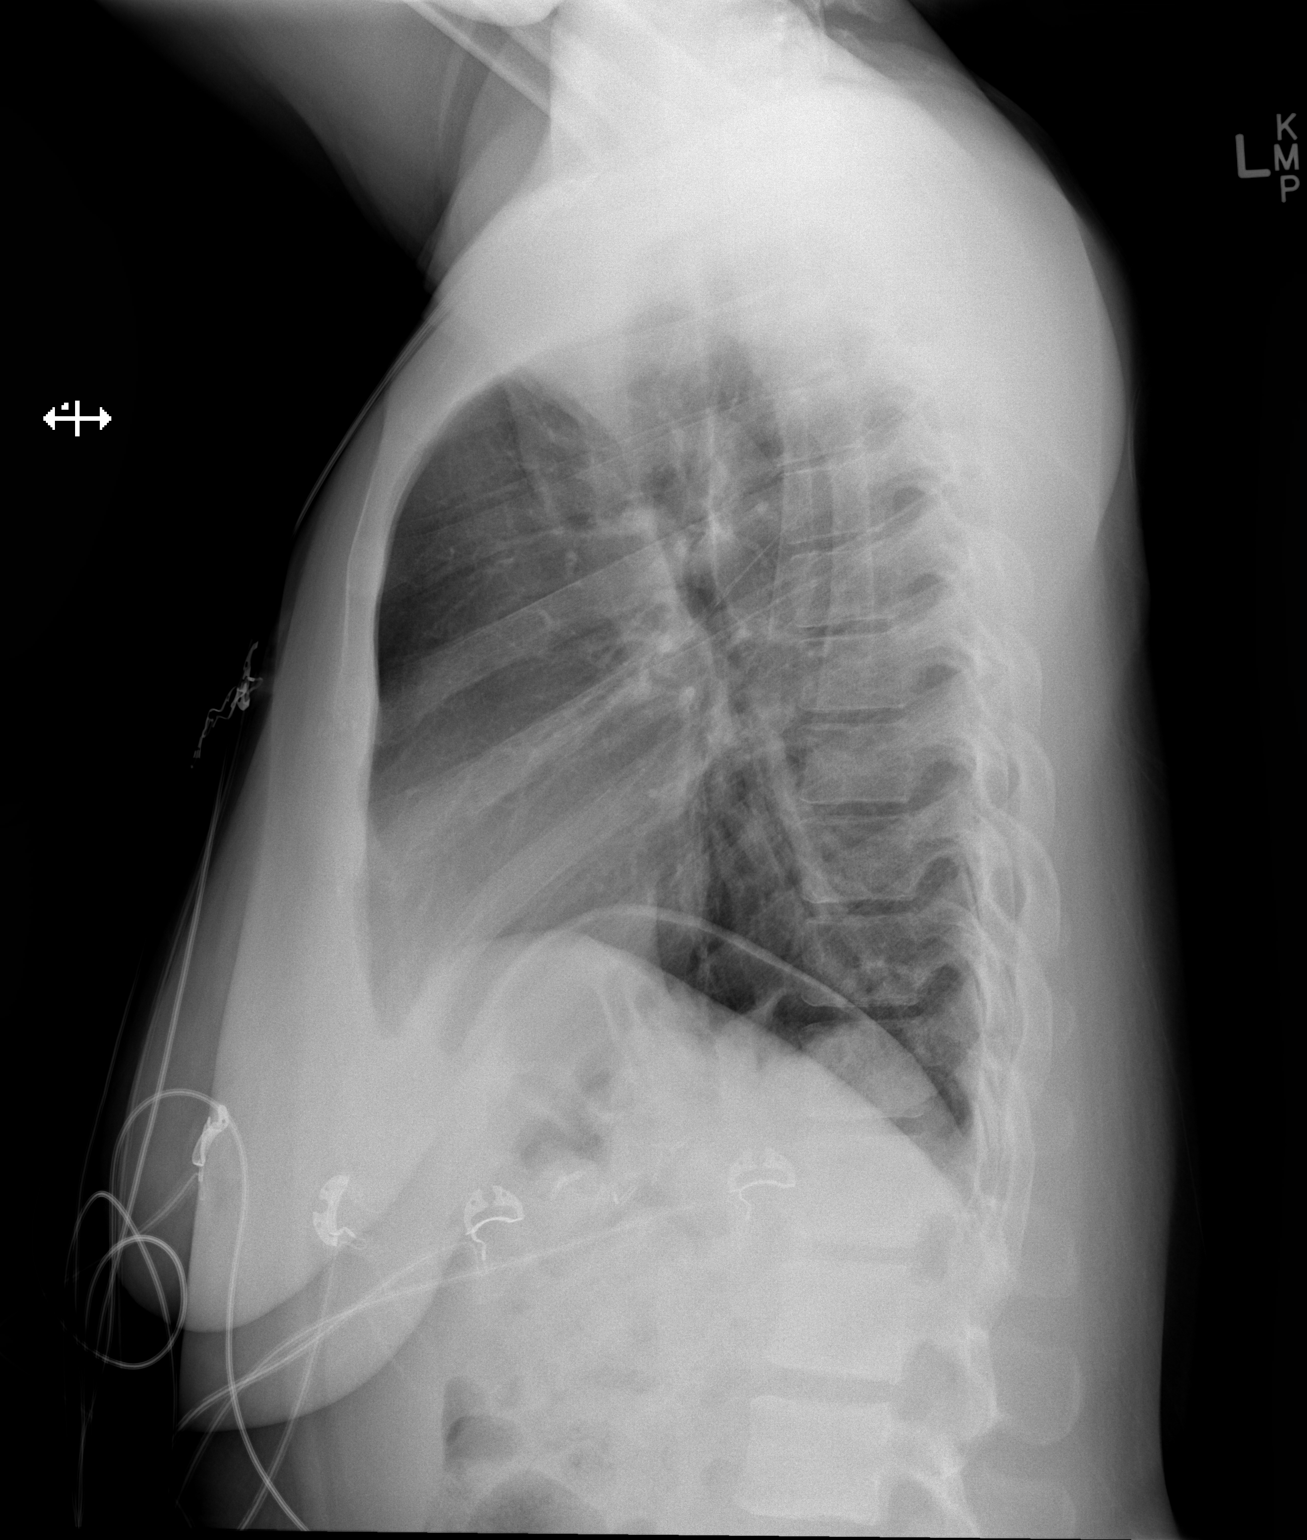

[2 of 2 positions shown; findings below may reference images not displayed]

FINDINGS: There is no edema or consolidation. The heart size and pulmonary
vascularity are normal. No pneumothorax. No adenopathy. No bone
lesions.
IMPRESSION: No edema or consolidation.

## 2015-08-13 ENCOUNTER — Other Ambulatory Visit: Payer: Self-pay | Admitting: Hematology and Oncology

## 2016-04-14 DIAGNOSIS — E282 Polycystic ovarian syndrome: Secondary | ICD-10-CM | POA: Insufficient documentation

## 2016-04-14 DIAGNOSIS — R635 Abnormal weight gain: Secondary | ICD-10-CM | POA: Insufficient documentation

## 2016-10-27 ENCOUNTER — Ambulatory Visit: Payer: Self-pay

## 2016-10-30 ENCOUNTER — Other Ambulatory Visit: Payer: Self-pay

## 2016-10-30 ENCOUNTER — Encounter: Payer: Self-pay | Admitting: Pulmonary Disease

## 2016-10-30 ENCOUNTER — Ambulatory Visit (INDEPENDENT_AMBULATORY_CARE_PROVIDER_SITE_OTHER): Payer: Self-pay | Admitting: Pulmonary Disease

## 2016-10-30 ENCOUNTER — Ambulatory Visit (HOSPITAL_COMMUNITY)
Admission: RE | Admit: 2016-10-30 | Discharge: 2016-10-30 | Disposition: A | Discharge status: Home / Self Care | Payer: Self-pay | Source: Ambulatory Visit | Attending: Family Medicine | Admitting: Family Medicine

## 2016-10-30 VITALS — BP 128/80 | HR 78 | Temp 97.9°F | Ht 65.0 in | Wt 180.7 lb

## 2016-10-30 DIAGNOSIS — D259 Leiomyoma of uterus, unspecified: Secondary | ICD-10-CM | POA: Insufficient documentation

## 2016-10-30 DIAGNOSIS — Z79899 Other long term (current) drug therapy: Secondary | ICD-10-CM

## 2016-10-30 DIAGNOSIS — N6312 Unspecified lump in the right breast, upper inner quadrant: Secondary | ICD-10-CM

## 2016-10-30 DIAGNOSIS — N921 Excessive and frequent menstruation with irregular cycle: Secondary | ICD-10-CM

## 2016-10-30 DIAGNOSIS — G4700 Insomnia, unspecified: Secondary | ICD-10-CM

## 2016-10-30 DIAGNOSIS — R079 Chest pain, unspecified: Secondary | ICD-10-CM | POA: Insufficient documentation

## 2016-10-30 DIAGNOSIS — D513 Other dietary vitamin B12 deficiency anemia: Secondary | ICD-10-CM

## 2016-10-30 DIAGNOSIS — R0789 Other chest pain: Secondary | ICD-10-CM

## 2016-10-30 DIAGNOSIS — D509 Iron deficiency anemia, unspecified: Secondary | ICD-10-CM

## 2016-10-30 DIAGNOSIS — Z8349 Family history of other endocrine, nutritional and metabolic diseases: Secondary | ICD-10-CM

## 2016-10-30 DIAGNOSIS — Z87898 Personal history of other specified conditions: Secondary | ICD-10-CM | POA: Insufficient documentation

## 2016-10-30 DIAGNOSIS — F5104 Psychophysiologic insomnia: Secondary | ICD-10-CM

## 2016-10-30 DIAGNOSIS — Z823 Family history of stroke: Secondary | ICD-10-CM

## 2016-10-30 DIAGNOSIS — Z9884 Bariatric surgery status: Secondary | ICD-10-CM

## 2016-10-30 DIAGNOSIS — E538 Deficiency of other specified B group vitamins: Secondary | ICD-10-CM

## 2016-10-30 DIAGNOSIS — E559 Vitamin D deficiency, unspecified: Secondary | ICD-10-CM

## 2016-10-30 DIAGNOSIS — D519 Vitamin B12 deficiency anemia, unspecified: Secondary | ICD-10-CM

## 2016-10-30 DIAGNOSIS — Z8249 Family history of ischemic heart disease and other diseases of the circulatory system: Secondary | ICD-10-CM

## 2016-10-30 DIAGNOSIS — N925 Other specified irregular menstruation: Secondary | ICD-10-CM

## 2016-10-30 DIAGNOSIS — N6310 Unspecified lump in the right breast, unspecified quadrant: Secondary | ICD-10-CM | POA: Insufficient documentation

## 2016-10-30 DIAGNOSIS — D500 Iron deficiency anemia secondary to blood loss (chronic): Secondary | ICD-10-CM

## 2016-10-30 DIAGNOSIS — Z800 Family history of malignant neoplasm of digestive organs: Secondary | ICD-10-CM

## 2016-10-30 DIAGNOSIS — Z813 Family history of other psychoactive substance abuse and dependence: Secondary | ICD-10-CM

## 2016-10-30 MED ORDER — ESOMEPRAZOLE MAGNESIUM 40 MG PO PACK: 40.0000 mg | PACK | Freq: Every day | ORAL | 1 refills | Status: DC

## 2016-10-30 MED ORDER — AMITRIPTYLINE HCL 25 MG PO TABS: 25.0000 mg | ORAL_TABLET | Freq: Every day | ORAL | 1 refills | Status: DC

## 2016-10-30 NOTE — Assessment & Plan Note (Signed)
Assessment: Her menstruation is every 4-8 weeks and lasts 7-8 days. The first three days are her heaviest bleeding and she goes through 6-7 overnight pads per day. By the fifth day, she tries to switch to heavy tampons and changes them every hour or so. She has a lot of cramping. She is not interested in having children any more. [place] in 2016 with probable 4 cm fibroid posteriorly in the uterus.  Plan: Recheck CBC and ferritin level Referral to gynecology. She is interested in surgical options. Not interested in having any future pregnancies.

## 2016-10-30 NOTE — Assessment & Plan Note (Addendum)
Recheck vitamin B12 level today.  ADDENDUM: B12 normal

## 2016-10-30 NOTE — Patient Instructions (Addendum)
Take esomeprazole daily 30 minutes before your first meal of the day for possible reflux We will refer you to gynecology Take amitriptyline at bedtime. Follow up in 6-8 weeks

## 2016-10-30 NOTE — Assessment & Plan Note (Addendum)
Recheck vitamin D level today  ADDENDUM: undetectable. Will start with 50000u vitamin d weekly. Recheck in 6 to 8 weeks. If still deficient, may need to do 50000u daily. After this, patients who remain deficient or insufficient on such doses will need to be treated with hydroxylated vitamin D metabolites because they are more readily absorbed, or with sun or sunlamp exposure.  Attempted to call and discuss lab results with patient on 2/6. Will leave message with triage if she calls back.

## 2016-10-30 NOTE — Assessment & Plan Note (Signed)
Assessment: No distinct mass but does have some fullness in LUQ of right chest in the 1-2 o clock region. Has history of breast cysts.  Plan: US breast ordered.

## 2016-10-30 NOTE — Assessment & Plan Note (Signed)
Assessment: Atypical chest pain in the setting of history of gastric bypass exacerbated by alcohol use probably GERD. EKG with no ischemic changes.   Plan: Nexium 40mg  daily Follow up in 6-8 weeks

## 2016-10-30 NOTE — Assessment & Plan Note (Addendum)
Assessment: Chronic issue. Did not have time to go through thorough sleep hygiene but she has been on multiple over the counter sleep medications. Also drinks wine to help fall asleep. Failed Lunesta. Also has tried melatonin - this worsens headache. Amitriptyline helps her sleep and chronic headaches but she is very groggy in the morning.   Plan: Reduce dose of amitriptyline to 25mg  daily at bedtime. Follow up in 6-8 weeks. Will need to parse further at follow up - counsel not using alcohol to help with sleep as this can worsen.

## 2016-10-30 NOTE — Progress Notes (Signed)
   CC: chest pain  HPI:  [name] is a 36 y.o. 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  She was seen by Dr. Iran Planas 04/14/2016 [facility]) for PCOS management. She was started on metformin. She never started this medication.  Chest pain in the left upper lateral aspect of the chest. Sharp and stabbing. Lasts 15 to 20 minutes. Occurs every few days and up to two times a day. Does not radiate anywhere. Seems to be more intense with drinking wine. No exacerbating factors or relieving factors. Can occur at rest - does not correlate with certain activities. No dyspnea or diaphoresis. Has overall been occurring for 5 months. Nothing similar prior. She does still get post gastric bypass "flushing." Mother had CABG around age 50.   Occasionally has heartburn. Not on medication for acid reflux.   One month ago noticed a soft mass on her right parasternal area. Nontender. Has not changed in size.    Past Medical History:  Diagnosis Date  . Anemia   . Headache 05/22/2014  . Insomnia   . Polycystic ovarian syndrome    Past Surgical History:  Procedure Laterality Date  . GASTRIC BYPASS  2004   Family History  Problem Relation Age of Onset  . Heart disease Mother   . Hyperlipidemia Mother   . Hypertension Mother   . Hyperlipidemia Father   . Hypertension Father   . Cancer Maternal Uncle     pancreatic ca  . Drug abuse Maternal Grandmother   . Drug abuse Paternal Grandmother   . Stroke Paternal Grandfather    Social History   Social History Narrative  . No narrative on file    Review of Systems:   12 point review of system performed and negative except per HPI  Physical Exam:  Vitals:   10/30/16 1439  BP: 128/80  Pulse: 78  Temp: 97.9 F  (36.6 C)  TempSrc: Oral  SpO2: 100%  Weight: 180 lb 11.2 oz (82 kg)  Height: 5\' 5"  (1.651 m)   General Apperance: NAD HEENT: Normocephalic, atraumatic, anicteric sclera Neck: Supple, trachea midline Chest: Area of fullness left upper quadrant of right breast that is parasternal, no distinct mass, soft, nontender Lungs: Clear to auscultation bilaterally. No wheezes, rhonchi or rales. Breathing comfortably Heart: Regular rate and rhythm, no murmur/rub/gallop Abdomen: Soft, nontender, nondistended, no rebound/guarding Extremities: Warm and well perfused, no edema Skin: No rashes or lesions Neurologic: Alert and interactive. No gross deficits.  I independently reviewed her EKG which shows normal sinus rhythm, no ST or T wave changes, unchanged compared to previous EKG.  Assessment & Plan:   See Encounters Tab for problem based charting.  Patient discussed with Dr. Angelia Mould

## 2016-10-31 ENCOUNTER — Encounter: Payer: Self-pay | Admitting: Obstetrics and Gynecology

## 2016-10-31 LAB — CBC
Hematocrit: 31.9 % — ABNORMAL LOW (ref 34.0–46.6)
Hemoglobin: 9.8 g/dL — ABNORMAL LOW (ref 11.1–15.9)
MCH: 24.3 pg — ABNORMAL LOW (ref 26.6–33.0)
MCHC: 30.7 g/dL — ABNORMAL LOW (ref 31.5–35.7)
MCV: 79 fL (ref 79–97)
PLATELETS: 327 10*3/uL (ref 150–379)
RBC: 4.03 x10E6/uL (ref 3.77–5.28)
RDW: 20.7 % — AB (ref 12.3–15.4)
WBC: 5.4 10*3/uL (ref 3.4–10.8)

## 2016-10-31 LAB — FERRITIN: Ferritin: 6 ng/mL — ABNORMAL LOW (ref 15–150)

## 2016-10-31 LAB — VITAMIN D 25 HYDROXY (VIT D DEFICIENCY, FRACTURES): Vit D, 25-Hydroxy: <4 ng/mL — ABNORMAL LOW (ref 30.0–100.0)

## 2016-10-31 LAB — VITAMIN B12: VITAMIN B 12: 239 pg/mL (ref 232–1245)

## 2016-10-31 MED ORDER — POLYSACCHARIDE IRON COMPLEX 15 MG/0.5ML PO LIQD: 150.0000 mg | Freq: Every day | ORAL | 5 refills | Status: DC

## 2016-10-31 MED ORDER — VITAMIN D (ERGOCALCIFEROL) 1.25 MG (50000 UNIT) PO CAPS: 50000.0000 [IU] | ORAL_CAPSULE | ORAL | 0 refills | Status: DC

## 2016-10-31 NOTE — Progress Notes (Signed)
Internal Medicine Clinic Attending  Case discussed with Dr. Krall at the time of the visit.  We reviewed the resident's history and exam and pertinent patient test results.  I agree with the assessment, diagnosis, and plan of care documented in the resident's note.  

## 2016-10-31 NOTE — Assessment & Plan Note (Signed)
Hgb lower and ferritin 6. Will order iron solution (patient prefers solution over capsules) 150mg  daily.

## 2016-10-31 NOTE — Addendum Note (Signed)
Addended by: Jacques Earthly T on: 10/31/2016 01:21 PM   Modules accepted: Orders

## 2016-11-01 ENCOUNTER — Telehealth: Payer: Self-pay | Admitting: Pulmonary Disease

## 2016-11-01 NOTE — Telephone Encounter (Signed)
Called pt, she states she has picked up 2 meds but her nexium is over 300.00, she state she really needs help with this, sending to dr Maudie Mercury, she was given dr Marjory Sneddon message and understands as she stated

## 2016-11-01 NOTE — Telephone Encounter (Signed)
Thanks, Bonnita Nasuti and Dr Maudie Mercury!

## 2016-11-01 NOTE — Telephone Encounter (Signed)
Dr. Randell Patient, patient said you called her yesterday. I can't find your note. Pls advice, thanks!

## 2016-11-01 NOTE — Telephone Encounter (Signed)
Patient says she is retuning call to dr Randell Patient from yesterday

## 2016-11-01 NOTE — Telephone Encounter (Signed)
-----   Message from Milagros Loll, MD sent at 10/31/2016  1:21 PM EST ----- If patient calls back, I was trying to call and let her know of her lab results.  Her iron level is low. I sent in a prescription for liquid iron she can take daily.  Her vitamin b12 level was normal  Her vitamin d level was low. I sent in a prescription for vitamin d tablets weekly. Will have vitamin d and iron rechecked in 6-8 weeks.  Thanks! Dr. Randell Patient

## 2016-11-01 NOTE — Telephone Encounter (Signed)
Spoke to patient about options, she stated she will check over the counter for omeprazole. She also inquired about the liquid iron which I informed her runs around $8 but pharmacy will likely need to order.  I advised her to contact us if any further questions. Patient verbalized understanding.

## 2016-11-02 ENCOUNTER — Other Ambulatory Visit: Payer: Self-pay | Admitting: Pulmonary Disease

## 2016-11-02 DIAGNOSIS — N6310 Unspecified lump in the right breast, unspecified quadrant: Secondary | ICD-10-CM

## 2016-11-06 ENCOUNTER — Other Ambulatory Visit (HOSPITAL_COMMUNITY): Payer: Self-pay | Admitting: *Deleted

## 2016-11-06 DIAGNOSIS — N6310 Unspecified lump in the right breast, unspecified quadrant: Secondary | ICD-10-CM

## 2016-11-16 ENCOUNTER — Other Ambulatory Visit: Payer: Self-pay

## 2016-11-16 ENCOUNTER — Ambulatory Visit (HOSPITAL_COMMUNITY): Payer: Self-pay

## 2016-11-30 ENCOUNTER — Ambulatory Visit (INDEPENDENT_AMBULATORY_CARE_PROVIDER_SITE_OTHER): Payer: Self-pay | Admitting: Obstetrics and Gynecology

## 2016-11-30 ENCOUNTER — Encounter: Payer: Self-pay | Admitting: Obstetrics and Gynecology

## 2016-11-30 VITALS — BP 116/84 | HR 79 | Wt 175.8 lb

## 2016-11-30 DIAGNOSIS — D259 Leiomyoma of uterus, unspecified: Secondary | ICD-10-CM

## 2016-11-30 DIAGNOSIS — Z3202 Encounter for pregnancy test, result negative: Secondary | ICD-10-CM

## 2016-11-30 DIAGNOSIS — B3731 Acute candidiasis of vulva and vagina: Secondary | ICD-10-CM

## 2016-11-30 DIAGNOSIS — N939 Abnormal uterine and vaginal bleeding, unspecified: Secondary | ICD-10-CM

## 2016-11-30 DIAGNOSIS — B373 Candidiasis of vulva and vagina: Secondary | ICD-10-CM

## 2016-11-30 LAB — POCT PREGNANCY, URINE: Preg Test, Ur: NEGATIVE

## 2016-11-30 NOTE — Progress Notes (Signed)
[place] scheduled March 15th @ 1100.  Pt notified.

## 2016-11-30 NOTE — Progress Notes (Signed)
Obstetrics and Gynecology New Patient Evaluation  Appointment Date: 11/30/2016  OBGYN Clinic: [facility]  Primary Care Provider: Lorayne Martinez  Referring Provider: Milagros Loll, MD  Chief Complaint:  Chief Complaint  Patient presents with  . Menorrhagia    History of Present Illness: Haley Martinez is a 36 y.o. African-American [id number] (Patient's last menstrual period was 10/28/2016 (exact date).), seen for the above chief complaint. Her past medical history is significant for fibroids, h/o anemia and blood transfusion, h/o roux en y GB.   Patient states that for the past 2 years her periods have been heavier and more painful. They last about 7 days and are heaviest and most painful for the first 4day and they occur regularly q month. No intermenstrual bleeding. Patient with h/o LNG x 5 years in the past.    No current VB, pain, discharge, vaginitis, chest pain, sob, dysuria, change in BMs. +vaginal irritation due to new soap use.   Review of Systems: as noted in the History of Present Illness.  Past Medical History:  Past Medical History:  Diagnosis Date  . Anemia   . Headache 05/22/2014  . Insomnia   . Polycystic ovarian syndrome     Past Surgical History:  Past Surgical History:  Procedure Laterality Date  . GASTRIC BYPASS  2004    Past Obstetrical History:  OB History  Gravida Para Term Preterm AB Living  3 1 1  0 2 1  SAB TAB Ectopic Multiple Live Births  2 0 0 0      # Outcome Date GA Lbr Len/2nd Weight Sex Delivery Anes PTL Lv  3 Term 07/04/99    F Vag-Spont     2 SAB           1 SAB               Past Gynecological History: As per HPI. Nothing for contraception Pap neg and hpv neg 2015  Social History:  Social History   Social History  . Marital status: Single    Spouse name: N/A  . Number of children: N/A  . Years of education: N/A   Occupational History  . Not on file.   Social History Main Topics  . Smoking  status: Never Smoker  . Smokeless tobacco: Never Used  . Alcohol use 3.0 oz/week    5 Glasses of wine per week     Comment: occasionally  . Drug use: No  . Sexual activity: Yes    Birth control/ protection: None   Other Topics Concern  . Not on file   Social History Narrative  . No narrative on file    Family History:  Family History  Problem Relation Age of Onset  . Heart disease Mother   . Hyperlipidemia Mother   . Hypertension Mother   . Hyperlipidemia Father   . Hypertension Father   . Cancer Maternal Uncle     pancreatic ca  . Drug abuse Maternal Grandmother   . Drug abuse Paternal Grandmother   . Stroke Paternal Grandfather    She denies any female cancers, bleeding or blood clotting disorders.   Medications Haley Martinez had no medications administered during this visit. Current Outpatient Prescriptions  Medication Sig Dispense Refill  . amitriptyline (ELAVIL) 25 MG tablet Take 1 tablet (25 mg total) by mouth at bedtime. 30 tablet 1  . Polysaccharide Iron Complex 15 MG/0.5ML LIQD Take 150 mg by mouth daily. 60 mL 5  . esomeprazole (NEXIUM)  40 MG packet Take 40 mg by mouth daily before breakfast. (Patient not taking: Reported on 11/30/2016) 30 each 1  . Vitamin D, Ergocalciferol, (DRISDOL) 50000 units CAPS capsule Take 1 capsule (50,000 Units total) by mouth every 7 (seven) days. (Patient not taking: Reported on 11/30/2016) 8 capsule 0   No current facility-administered medications for this visit.     Allergies Trazodone and nefazodone   Physical Exam:  BP 116/84   Pulse 79   Wt 175 lb 12.8 oz (79.7 kg)   LMP 10/28/2016 (Exact Date)   BMI 29.25 kg/m  Body mass index is 29.25 kg/m. General appearance: Well nourished, well developed female in no acute distress.  Cardiovascular: normal s1 and s2.  No murmurs, rubs or gallops. Respiratory:  Clear to auscultation bilateral. Normal respiratory effort Abdomen: positive bowel sounds and no masses, hernias; diffusely  non tender to palpation, non distended Neuro/Psych:  Normal mood and affect.  Skin:  Warm and dry.  Lymphatic:  No inguinal lymphadenopathy.   Pelvic exam: is not limited by body habitus EGBUS: within normal limits, Vagina: within normal limits and with no blood or discharge in the vault, Cervix: normal appearing cervix without tenderness, discharge or lesions. Uterus:  enlarged, c/w 10-12 week size and non tender, mobile, mild to moderate decensus, and Adnexa:  normal adnexa and no mass, fullness, tenderness Rectovaginal: deferred  Laboratory: UPT negative  Radiology:  CLINICAL DATA:  Menorrhagia.  EXAM: TRANSABDOMINAL AND TRANSVAGINAL ULTRASOUND OF PELVIS  TECHNIQUE: Both transabdominal and transvaginal ultrasound examinations of the pelvis were performed. Transabdominal technique was performed for global imaging of the pelvis including uterus, ovaries, adnexal regions, and pelvic cul-de-sac. It was necessary to proceed with endovaginal exam following the transabdominal exam to visualize the ovaries.  COMPARISON:  None  FINDINGS: Uterus  Measurements: 9.4 x 6.3 x 6.0 cm. Rounded hypoechoic area measuring 4.0 x 3.8 x 3.5 cm is seen posteriorly in the uterus most consistent with fibroid.  Endometrium  Thickness: 7 mm which is within normal limits. No focal abnormality visualized.  Right ovary  Measurements: 5.5 x 2.2 x 2.0 cm. Multiple follicular cysts are noted which is within normal limits.  Left ovary  Measurements: 5.3 x 3.0 x 2.0 cm. Multiple follicular cysts are noted which is within normal limits.  Other findings  Small amount of free fluid is noted which most likely is physiologic.  IMPRESSION: Probable 4 cm fibroid posteriorly in the uterus. No other significant abnormality seen in the pelvis.   Electronically Signed   By: Haley Martinez M.D.   On: 10/30/2014 10:23  Assessment: pt stable  Plan:  1. Menorrhagia Medical and  surgical options d/w her primarily LNG IUD vs hyst; not an OCP candidate given GB history. She is really wanting definitive treatment, which I told her is reasonable given her bleeding and anemia history. I told her that ablation is an option but I don't recommend it given her age and similar efficacy with LNG-IUD. D/w her possibility of MIS. I told her that I recommend an u/s to assess for uterine characteristics, as ES may not even be amenable anymore to IUD placement; prior images reviewed and at that time the ES and cavity were normal and could've had an IUD placed. Will call patient after after u/s back. No need for EMBx given regular heavy bleeding and negative history.  - US Transvaginal Non-OB; Future - US Pelvis Complete; Future  Orders Placed This Encounter  Procedures  . US Transvaginal Non-OB  .  US Pelvis Complete  . Pregnancy, urine POC    RTC after u/s results.   Durene Romans MD Attending [facility])

## 2016-12-07 ENCOUNTER — Ambulatory Visit (HOSPITAL_COMMUNITY)
Admission: RE | Admit: 2016-12-07 | Discharge: 2016-12-07 | Disposition: A | Discharge status: Home / Self Care | Payer: Self-pay | Source: Ambulatory Visit | Attending: Obstetrics and Gynecology | Admitting: Obstetrics and Gynecology

## 2016-12-07 DIAGNOSIS — D259 Leiomyoma of uterus, unspecified: Secondary | ICD-10-CM

## 2016-12-07 DIAGNOSIS — D251 Intramural leiomyoma of uterus: Secondary | ICD-10-CM | POA: Insufficient documentation

## 2016-12-07 DIAGNOSIS — N920 Excessive and frequent menstruation with regular cycle: Secondary | ICD-10-CM | POA: Insufficient documentation

## 2016-12-07 DIAGNOSIS — N939 Abnormal uterine and vaginal bleeding, unspecified: Secondary | ICD-10-CM

## 2016-12-14 ENCOUNTER — Ambulatory Visit (HOSPITAL_COMMUNITY)
Admission: RE | Admit: 2016-12-14 | Discharge: 2016-12-14 | Disposition: A | Discharge status: Home / Self Care | Payer: Self-pay | Source: Ambulatory Visit | Attending: Obstetrics and Gynecology | Admitting: Obstetrics and Gynecology

## 2016-12-14 ENCOUNTER — Ambulatory Visit
Admission: RE | Admit: 2016-12-14 | Discharge: 2016-12-14 | Disposition: A | Discharge status: Home / Self Care | Payer: No Typology Code available for payment source | Source: Ambulatory Visit | Attending: Obstetrics and Gynecology | Admitting: Obstetrics and Gynecology

## 2016-12-14 ENCOUNTER — Encounter (HOSPITAL_COMMUNITY): Payer: Self-pay

## 2016-12-14 ENCOUNTER — Telehealth: Payer: Self-pay | Admitting: Obstetrics and Gynecology

## 2016-12-14 VITALS — BP 138/76 | Temp 99.0°F | Ht 65.0 in | Wt 177.0 lb

## 2016-12-14 DIAGNOSIS — N6310 Unspecified lump in the right breast, unspecified quadrant: Secondary | ICD-10-CM

## 2016-12-14 DIAGNOSIS — N6311 Unspecified lump in the right breast, upper outer quadrant: Secondary | ICD-10-CM

## 2016-12-14 DIAGNOSIS — N6315 Unspecified lump in the right breast, overlapping quadrants: Secondary | ICD-10-CM

## 2016-12-14 DIAGNOSIS — Z1239 Encounter for other screening for malignant neoplasm of breast: Secondary | ICD-10-CM

## 2016-12-14 NOTE — Telephone Encounter (Signed)
GYN Telephone Note Patient called at [phone] and u/s results reviewed with her and cavity looks normal and could most likely be able to fit an IUD normally. Options again d/w her (medical, abaltion, hyst) and she'd like to proceed with hyst. D/w he re: standard surgical risks and that most likely will be able to do it minimally invasively but always a risk of open procedure. Request sent for TLH/BS/cysto  Durene Romans MD Attending [facility] for [place] (Faculty Practice) 12/14/2016 Time: [phone]

## 2016-12-14 NOTE — Patient Instructions (Signed)
Explained breast self awareness with Haley Martinez. Patient did not need a Pap smear today due to last Pap smear and HPV typing was 04/17/2014. Let her know BCCCP will cover Pap smears and HPV typing every 5 years unless has a history of abnormal Pap smears. Referred patient to the [facility] for diagnostic mammogram and possible right breast ultrasound. Appointment scheduled for Thursday, December 14, 2016 at 1450.  Haley Martinez verbalized understanding.  [place], Arvil Chaco, RN 2:43 PM

## 2016-12-14 NOTE — Progress Notes (Signed)
Complaints of multiple right breast lumps.  Pap Smear:  Pap smear not completed today. Last Pap smear was 04/17/2014 at the [facility] [place] [facility] and normal with negative HPV. Per patient has no history of an abnormal Pap smear. Last Pap smear result is in EPIC.  Physical exam: Breasts Breasts symmetrical. No skin abnormalities bilateral breasts. No nipple retraction bilateral breasts. No nipple discharge bilateral breasts. No lymphadenopathy. No lumps palpated left breast. Palpated two mobile lumps within the right breast at 11 o'clock 8 cm from the nipple and 3 o'clock 4 cm from the nipple. No complaints of pain or tenderness on exam. Referred patient to the [facility] for diagnostic mammogram and possible right breast ultrasound. Appointment scheduled for Thursday, December 14, 2016 at 1450.        Pelvic/Bimanual No Pap smear completed today since last Pap smear and HPV typing was 04/17/2014. Pap smear not indicated per BCCCP guidelines.   Smoking History: Patient has never smoked.  Patient Navigation: Patient education provided. Access to services provided for patient through [facility] program.

## 2016-12-15 ENCOUNTER — Encounter (HOSPITAL_COMMUNITY): Payer: Self-pay

## 2016-12-19 ENCOUNTER — Encounter (HOSPITAL_COMMUNITY): Payer: Self-pay | Admitting: *Deleted

## 2017-01-02 ENCOUNTER — Other Ambulatory Visit: Payer: Self-pay | Admitting: Obstetrics and Gynecology

## 2017-01-04 NOTE — Patient Instructions (Addendum)
Your procedure is scheduled on:  Tuesday, 5/24  Enter through the Main Entrance of [facility] at: 11:30 am  Pick up the phone at the desk and dial [phone].  Call this number if you have problems the morning of surgery: [phone].  Remember: Do NOT eat food after midnight Monday, 4//23  Do NOT drink clear liquids after: 9 am Tuesday, day of surgery  Take these medicines the morning of surgery with a SIP OF WATER:  zantac  Do NOT wear jewelry (body piercing), metal hair clips/bobby pins, make-up, or nail polish. Do NOT wear lotions, powders, or perfumes.  You may wear deoderant. Do NOT shave for 48 hours prior to surgery. Do NOT bring valuables to the hospital. Contacts may not be worn into surgery.  Leave suitcase in car.  After surgery it may be brought to your room.  For patients admitted to the hospital, checkout time is 11:00 AM the day of discharge. Have a responsible adult drive you home and stay with you for 24 hours after your procedure.  Home with [place] cell [phone]

## 2017-01-05 ENCOUNTER — Encounter (HOSPITAL_COMMUNITY): Payer: Self-pay

## 2017-01-05 ENCOUNTER — Encounter (HOSPITAL_COMMUNITY)
Admission: RE | Admit: 2017-01-05 | Discharge: 2017-01-05 | Disposition: A | Discharge status: Home / Self Care | Payer: No Typology Code available for payment source | Source: Ambulatory Visit | Attending: Obstetrics and Gynecology | Admitting: Obstetrics and Gynecology

## 2017-01-05 DIAGNOSIS — Z79899 Other long term (current) drug therapy: Secondary | ICD-10-CM | POA: Insufficient documentation

## 2017-01-05 DIAGNOSIS — D649 Anemia, unspecified: Secondary | ICD-10-CM | POA: Insufficient documentation

## 2017-01-05 DIAGNOSIS — Z8249 Family history of ischemic heart disease and other diseases of the circulatory system: Secondary | ICD-10-CM | POA: Insufficient documentation

## 2017-01-05 DIAGNOSIS — Z9884 Bariatric surgery status: Secondary | ICD-10-CM | POA: Insufficient documentation

## 2017-01-05 DIAGNOSIS — Z01812 Encounter for preprocedural laboratory examination: Secondary | ICD-10-CM | POA: Insufficient documentation

## 2017-01-05 DIAGNOSIS — E282 Polycystic ovarian syndrome: Secondary | ICD-10-CM | POA: Insufficient documentation

## 2017-01-05 DIAGNOSIS — Z888 Allergy status to other drugs, medicaments and biological substances status: Secondary | ICD-10-CM | POA: Insufficient documentation

## 2017-01-05 DIAGNOSIS — Z823 Family history of stroke: Secondary | ICD-10-CM | POA: Insufficient documentation

## 2017-01-05 DIAGNOSIS — N920 Excessive and frequent menstruation with regular cycle: Secondary | ICD-10-CM | POA: Insufficient documentation

## 2017-01-05 DIAGNOSIS — Z800 Family history of malignant neoplasm of digestive organs: Secondary | ICD-10-CM | POA: Insufficient documentation

## 2017-01-05 DIAGNOSIS — D259 Leiomyoma of uterus, unspecified: Secondary | ICD-10-CM | POA: Insufficient documentation

## 2017-01-05 HISTORY — DX: Personal history of other medical treatment: Z92.89

## 2017-01-05 HISTORY — DX: Gastro-esophageal reflux disease without esophagitis: K21.9

## 2017-01-05 LAB — CBC
HEMATOCRIT: 33.2 % — AB (ref 36.0–46.0)
HEMOGLOBIN: 10.3 g/dL — AB (ref 12.0–15.0)
MCH: 24.6 pg — AB (ref 26.0–34.0)
MCHC: 31 g/dL (ref 30.0–36.0)
MCV: 79.2 fL (ref 78.0–100.0)
Platelets: 386 10*3/uL (ref 150–400)
RBC: 4.19 MIL/uL (ref 3.87–5.11)
RDW: 18.4 % — ABNORMAL HIGH (ref 11.5–15.5)
WBC: 4 10*3/uL (ref 4.0–10.5)

## 2017-01-05 LAB — COMPREHENSIVE METABOLIC PANEL
ALBUMIN: 4.4 g/dL (ref 3.5–5.0)
ALK PHOS: 54 U/L (ref 38–126)
ALT: 9 U/L — AB (ref 14–54)
ANION GAP: 7 (ref 5–15)
AST: 19 U/L (ref 15–41)
BILIRUBIN TOTAL: 0.8 mg/dL (ref 0.3–1.2)
BUN: 10 mg/dL (ref 6–20)
CALCIUM: 8.8 mg/dL — AB (ref 8.9–10.3)
CO2: 25 mmol/L (ref 22–32)
CREATININE: 0.65 mg/dL (ref 0.44–1.00)
Chloride: 105 mmol/L (ref 101–111)
GFR calc Af Amer: >60 mL/min (ref >60)
GFR calc non Af Amer: >60 mL/min (ref >60)
GLUCOSE: 84 mg/dL (ref 65–99)
Potassium: 3.8 mmol/L (ref 3.5–5.1)
SODIUM: 137 mmol/L (ref 135–145)
TOTAL PROTEIN: 7.6 g/dL (ref 6.5–8.1)

## 2017-01-05 LAB — TYPE AND SCREEN
ABO/RH(D): O POS
Antibody Screen: NEGATIVE

## 2017-01-05 LAB — ABO/RH: ABO/RH(D): O POS

## 2017-01-05 NOTE — Pre-Procedure Instructions (Signed)
SDS BB history log given to lab for patient's previous iron/blood transfusion at [facility] 04/2014.

## 2017-01-16 ENCOUNTER — Encounter (HOSPITAL_COMMUNITY)
Admission: RE | Disposition: A | Discharge status: Home / Self Care | Payer: Self-pay | Source: Ambulatory Visit | Attending: Obstetrics and Gynecology

## 2017-01-16 ENCOUNTER — Encounter: Payer: Self-pay | Admitting: Obstetrics and Gynecology

## 2017-01-16 ENCOUNTER — Ambulatory Visit (HOSPITAL_COMMUNITY): Payer: Self-pay | Admitting: Anesthesiology

## 2017-01-16 ENCOUNTER — Observation Stay (HOSPITAL_COMMUNITY)
Admission: RE | Admit: 2017-01-16 | Discharge: 2017-01-17 | Disposition: A | Discharge status: Home / Self Care | Payer: Self-pay | Source: Ambulatory Visit | Attending: Obstetrics and Gynecology | Admitting: Obstetrics and Gynecology

## 2017-01-16 DIAGNOSIS — N946 Dysmenorrhea, unspecified: Secondary | ICD-10-CM | POA: Insufficient documentation

## 2017-01-16 DIAGNOSIS — Z8042 Family history of malignant neoplasm of prostate: Secondary | ICD-10-CM | POA: Insufficient documentation

## 2017-01-16 DIAGNOSIS — D259 Leiomyoma of uterus, unspecified: Secondary | ICD-10-CM | POA: Insufficient documentation

## 2017-01-16 DIAGNOSIS — E559 Vitamin D deficiency, unspecified: Secondary | ICD-10-CM | POA: Insufficient documentation

## 2017-01-16 DIAGNOSIS — N920 Excessive and frequent menstruation with regular cycle: Secondary | ICD-10-CM | POA: Insufficient documentation

## 2017-01-16 DIAGNOSIS — N72 Inflammatory disease of cervix uteri: Principal | ICD-10-CM | POA: Insufficient documentation

## 2017-01-16 DIAGNOSIS — Z823 Family history of stroke: Secondary | ICD-10-CM | POA: Insufficient documentation

## 2017-01-16 DIAGNOSIS — Z8489 Family history of other specified conditions: Secondary | ICD-10-CM | POA: Insufficient documentation

## 2017-01-16 DIAGNOSIS — Z9884 Bariatric surgery status: Secondary | ICD-10-CM | POA: Insufficient documentation

## 2017-01-16 DIAGNOSIS — D509 Iron deficiency anemia, unspecified: Secondary | ICD-10-CM | POA: Insufficient documentation

## 2017-01-16 DIAGNOSIS — E669 Obesity, unspecified: Secondary | ICD-10-CM | POA: Insufficient documentation

## 2017-01-16 DIAGNOSIS — R7303 Prediabetes: Secondary | ICD-10-CM | POA: Insufficient documentation

## 2017-01-16 DIAGNOSIS — D519 Vitamin B12 deficiency anemia, unspecified: Secondary | ICD-10-CM | POA: Insufficient documentation

## 2017-01-16 DIAGNOSIS — Z6827 Body mass index (BMI) 27.0-27.9, adult: Secondary | ICD-10-CM | POA: Insufficient documentation

## 2017-01-16 DIAGNOSIS — R51 Headache: Secondary | ICD-10-CM | POA: Insufficient documentation

## 2017-01-16 DIAGNOSIS — G4700 Insomnia, unspecified: Secondary | ICD-10-CM | POA: Insufficient documentation

## 2017-01-16 DIAGNOSIS — K219 Gastro-esophageal reflux disease without esophagitis: Secondary | ICD-10-CM | POA: Insufficient documentation

## 2017-01-16 DIAGNOSIS — N6310 Unspecified lump in the right breast, unspecified quadrant: Secondary | ICD-10-CM | POA: Insufficient documentation

## 2017-01-16 DIAGNOSIS — Z90710 Acquired absence of both cervix and uterus: Secondary | ICD-10-CM | POA: Diagnosis present

## 2017-01-16 DIAGNOSIS — E282 Polycystic ovarian syndrome: Secondary | ICD-10-CM | POA: Insufficient documentation

## 2017-01-16 DIAGNOSIS — Z8249 Family history of ischemic heart disease and other diseases of the circulatory system: Secondary | ICD-10-CM | POA: Insufficient documentation

## 2017-01-16 HISTORY — PX: BILATERAL SALPINGECTOMY: SHX5743

## 2017-01-16 HISTORY — PX: CYSTOSCOPY: SHX5120

## 2017-01-16 HISTORY — PX: VAGINAL HYSTERECTOMY: SHX2639

## 2017-01-16 LAB — PREGNANCY, URINE: Preg Test, Ur: NEGATIVE

## 2017-01-16 SURGERY — HYSTERECTOMY, VAGINAL
Anesthesia: General | Site: Vagina

## 2017-01-16 MED ORDER — ROCURONIUM BROMIDE 100 MG/10ML IV SOLN
INTRAVENOUS | Status: DC | PRN
  Administered 2017-01-16: 60 mg via INTRAVENOUS

## 2017-01-16 MED ORDER — HYDROMORPHONE HCL 1 MG/ML IJ SOLN
INTRAMUSCULAR | Status: AC
  Filled 2017-01-16: qty 1

## 2017-01-16 MED ORDER — OXYCODONE HCL 5 MG PO TABS
5.0000 mg | ORAL_TABLET | ORAL | Status: DC | PRN
  Administered 2017-01-16 – 2017-01-17 (×4): 10 mg via ORAL
  Filled 2017-01-16 (×4): qty 2

## 2017-01-16 MED ORDER — MIDAZOLAM HCL 2 MG/2ML IJ SOLN
INTRAMUSCULAR | Status: AC
  Filled 2017-01-16: qty 2

## 2017-01-16 MED ORDER — PROPOFOL 10 MG/ML IV BOLUS
INTRAVENOUS | Status: AC
  Filled 2017-01-16: qty 20

## 2017-01-16 MED ORDER — HYDROMORPHONE HCL 1 MG/ML IJ SOLN
INTRAMUSCULAR | Status: AC
  Administered 2017-01-16: 0.5 mg via INTRAVENOUS
  Filled 2017-01-16: qty 1

## 2017-01-16 MED ORDER — ONDANSETRON HCL 4 MG/2ML IJ SOLN: 4.0000 mg | Freq: Four times a day (QID) | INTRAMUSCULAR | Status: DC | PRN

## 2017-01-16 MED ORDER — MEPERIDINE HCL 25 MG/ML IJ SOLN: 6.2500 mg | INTRAMUSCULAR | Status: DC | PRN

## 2017-01-16 MED ORDER — MIDAZOLAM HCL 5 MG/5ML IJ SOLN
INTRAMUSCULAR | Status: DC | PRN
  Administered 2017-01-16: 2 mg via INTRAVENOUS

## 2017-01-16 MED ORDER — METOCLOPRAMIDE HCL 5 MG/ML IJ SOLN
INTRAMUSCULAR | Status: AC
  Administered 2017-01-16: 10 mg via INTRAVENOUS
  Filled 2017-01-16: qty 2

## 2017-01-16 MED ORDER — KETOROLAC TROMETHAMINE 30 MG/ML IJ SOLN
INTRAMUSCULAR | Status: AC
  Filled 2017-01-16: qty 1

## 2017-01-16 MED ORDER — KETOROLAC TROMETHAMINE 30 MG/ML IJ SOLN
INTRAMUSCULAR | Status: DC | PRN
  Administered 2017-01-16: 30 mg via INTRAVENOUS

## 2017-01-16 MED ORDER — LIDOCAINE HCL (CARDIAC) 20 MG/ML IV SOLN
INTRAVENOUS | Status: AC
  Filled 2017-01-16: qty 5

## 2017-01-16 MED ORDER — HYDROMORPHONE HCL 1 MG/ML IJ SOLN
INTRAMUSCULAR | Status: DC | PRN
Start: 2017-01-16 — End: 2017-01-16
  Administered 2017-01-16: 1 mg via INTRAVENOUS

## 2017-01-16 MED ORDER — LIDOCAINE-EPINEPHRINE 1 %-1:100000 IJ SOLN
INTRAMUSCULAR | Status: AC
  Filled 2017-01-16: qty 1

## 2017-01-16 MED ORDER — SUGAMMADEX SODIUM 200 MG/2ML IV SOLN
INTRAVENOUS | Status: DC | PRN
  Administered 2017-01-16: 150 mg via INTRAVENOUS

## 2017-01-16 MED ORDER — HYDROMORPHONE HCL 1 MG/ML IJ SOLN
0.5000 mg | INTRAMUSCULAR | Status: DC | PRN
  Administered 2017-01-16 (×2): 0.5 mg via INTRAVENOUS

## 2017-01-16 MED ORDER — SODIUM CHLORIDE 0.9 % IV SOLN
INTRAVENOUS | Status: DC
  Administered 2017-01-16: 19:00:00 via INTRAVENOUS

## 2017-01-16 MED ORDER — FENTANYL CITRATE (PF) 250 MCG/5ML IJ SOLN
INTRAMUSCULAR | Status: AC
  Filled 2017-01-16: qty 5

## 2017-01-16 MED ORDER — CEFAZOLIN SODIUM-DEXTROSE 2-4 GM/100ML-% IV SOLN
2.0000 g | Freq: Once | INTRAVENOUS | Status: AC
  Administered 2017-01-16: 2 g via INTRAVENOUS
  Filled 2017-01-16: qty 100

## 2017-01-16 MED ORDER — ONDANSETRON HCL 4 MG PO TABS: 4.0000 mg | ORAL_TABLET | Freq: Four times a day (QID) | ORAL | Status: DC | PRN

## 2017-01-16 MED ORDER — ONDANSETRON HCL 4 MG/2ML IJ SOLN
INTRAMUSCULAR | Status: AC
  Filled 2017-01-16: qty 2

## 2017-01-16 MED ORDER — SUGAMMADEX SODIUM 200 MG/2ML IV SOLN
INTRAVENOUS | Status: AC
  Filled 2017-01-16: qty 2

## 2017-01-16 MED ORDER — LACTATED RINGERS IV SOLN
INTRAVENOUS | Status: DC
  Administered 2017-01-16: 13:00:00 via INTRAVENOUS
  Administered 2017-01-16: 125 mL/h via INTRAVENOUS
  Administered 2017-01-16: 12:00:00 via INTRAVENOUS

## 2017-01-16 MED ORDER — LIDOCAINE HCL (CARDIAC) 20 MG/ML IV SOLN
INTRAVENOUS | Status: DC | PRN
  Administered 2017-01-16: 100 mg via INTRAVENOUS

## 2017-01-16 MED ORDER — PROPOFOL 10 MG/ML IV BOLUS
INTRAVENOUS | Status: DC | PRN
  Administered 2017-01-16: 200 mg via INTRAVENOUS

## 2017-01-16 MED ORDER — STERILE WATER FOR IRRIGATION IR SOLN
Status: DC | PRN
  Administered 2017-01-16: 1000 mL via INTRAVESICAL

## 2017-01-16 MED ORDER — POLYETHYLENE GLYCOL 3350 17 G PO PACK
17.0000 g | PACK | Freq: Every day | ORAL | Status: DC
  Administered 2017-01-17: 17 g via ORAL
  Filled 2017-01-16: qty 1

## 2017-01-16 MED ORDER — 0.9 % SODIUM CHLORIDE (POUR BTL) OPTIME
TOPICAL | Status: DC | PRN
  Administered 2017-01-16: 1000 mL

## 2017-01-16 MED ORDER — DEXAMETHASONE SODIUM PHOSPHATE 10 MG/ML IJ SOLN
INTRAMUSCULAR | Status: DC | PRN
  Administered 2017-01-16: 10 mg via INTRAVENOUS

## 2017-01-16 MED ORDER — PANTOPRAZOLE SODIUM 40 MG PO TBEC
40.0000 mg | DELAYED_RELEASE_TABLET | Freq: Every day | ORAL | Status: DC
  Administered 2017-01-17: 40 mg via ORAL
  Filled 2017-01-16: qty 1

## 2017-01-16 MED ORDER — LACTATED RINGERS IV SOLN: INTRAVENOUS | Status: DC

## 2017-01-16 MED ORDER — SCOPOLAMINE 1 MG/3DAYS TD PT72
1.0000 | MEDICATED_PATCH | Freq: Once | TRANSDERMAL | Status: DC
  Administered 2017-01-16: 1.5 mg via TRANSDERMAL
  Filled 2017-01-16: qty 1

## 2017-01-16 MED ORDER — SODIUM CHLORIDE 0.9 % IV SOLN: INTRAVENOUS | Status: DC

## 2017-01-16 MED ORDER — SIMETHICONE 80 MG PO CHEW
80.0000 mg | CHEWABLE_TABLET | Freq: Three times a day (TID) | ORAL | Status: DC
  Administered 2017-01-17: 80 mg via ORAL
  Filled 2017-01-16: qty 1

## 2017-01-16 MED ORDER — SCOPOLAMINE 1 MG/3DAYS TD PT72
MEDICATED_PATCH | TRANSDERMAL | Status: AC
  Administered 2017-01-16: 1.5 mg via TRANSDERMAL
  Filled 2017-01-16: qty 1

## 2017-01-16 MED ORDER — HYDROMORPHONE HCL 1 MG/ML IJ SOLN
0.2500 mg | INTRAMUSCULAR | Status: DC | PRN
  Administered 2017-01-16 (×4): 0.5 mg via INTRAVENOUS

## 2017-01-16 MED ORDER — ROCURONIUM BROMIDE 100 MG/10ML IV SOLN
INTRAVENOUS | Status: AC
  Filled 2017-01-16: qty 1

## 2017-01-16 MED ORDER — LIDOCAINE-EPINEPHRINE 1 %-1:100000 IJ SOLN
INTRAMUSCULAR | Status: DC | PRN
  Administered 2017-01-16: 10 mL

## 2017-01-16 MED ORDER — ONDANSETRON HCL 4 MG/2ML IJ SOLN
INTRAMUSCULAR | Status: DC | PRN
  Administered 2017-01-16: 4 mg via INTRAVENOUS

## 2017-01-16 MED ORDER — ACETAMINOPHEN 325 MG PO TABS
650.0000 mg | ORAL_TABLET | ORAL | Status: DC | PRN
  Administered 2017-01-17: 650 mg via ORAL
  Filled 2017-01-16: qty 2

## 2017-01-16 MED ORDER — FENTANYL CITRATE (PF) 100 MCG/2ML IJ SOLN
INTRAMUSCULAR | Status: DC | PRN
  Administered 2017-01-16 (×2): 100 ug via INTRAVENOUS
  Administered 2017-01-16: 50 ug via INTRAVENOUS

## 2017-01-16 MED ORDER — HYDROMORPHONE HCL 1 MG/ML IJ SOLN
0.5000 mg | INTRAMUSCULAR | Status: AC | PRN
  Administered 2017-01-16 – 2017-01-17 (×3): 0.5 mg via INTRAVENOUS
  Filled 2017-01-16 (×3): qty 1

## 2017-01-16 MED ORDER — METOCLOPRAMIDE HCL 5 MG/ML IJ SOLN
10.0000 mg | Freq: Once | INTRAMUSCULAR | Status: AC | PRN
  Administered 2017-01-16: 10 mg via INTRAVENOUS

## 2017-01-16 MED ORDER — IBUPROFEN 600 MG PO TABS
600.0000 mg | ORAL_TABLET | Freq: Four times a day (QID) | ORAL | Status: DC
  Administered 2017-01-16 – 2017-01-17 (×3): 600 mg via ORAL
  Filled 2017-01-16 (×3): qty 1

## 2017-01-16 SURGICAL SUPPLY — 31 items
CANISTER SUCT 3000ML PPV (MISCELLANEOUS) ×3 IMPLANT
CLOTH BEACON ORANGE TIMEOUT ST (SAFETY) ×3 IMPLANT
CONT PATH 16OZ SNAP LID 3702 (MISCELLANEOUS) ×3 IMPLANT
DECANTER SPIKE VIAL GLASS SM (MISCELLANEOUS) ×3 IMPLANT
DRAPE SHEET LG 3/4 BI-LAMINATE (DRAPES) ×3 IMPLANT
ELECT CAUTERY BLADE 6.4 (BLADE) ×3 IMPLANT
ELECT REM PT RETURN 9FT ADLT (ELECTROSURGICAL) ×3
ELECTRODE REM PT RTRN 9FT ADLT (ELECTROSURGICAL) ×2 IMPLANT
GAUZE PACKING 2X5 YD STRL (GAUZE/BANDAGES/DRESSINGS) ×3 IMPLANT
GAUZE SPONGE 4X4 16PLY XRAY LF (GAUZE/BANDAGES/DRESSINGS) ×3 IMPLANT
GLOVE BIOGEL PI IND STRL 7.5 (GLOVE) ×2 IMPLANT
GLOVE BIOGEL PI INDICATOR 7.5 (GLOVE) ×1
GLOVE SURG SS PI 7.0 STRL IVOR (GLOVE) ×6 IMPLANT
GOWN STRL REUS W/TWL LRG LVL3 (GOWN DISPOSABLE) ×12 IMPLANT
NS IRRIG 1000ML POUR BTL (IV SOLUTION) ×3 IMPLANT
PACK TRENDGUARD 600 HYBRD PROC (MISCELLANEOUS) IMPLANT
PACK VAGINAL WOMENS (CUSTOM PROCEDURE TRAY) ×3 IMPLANT
PAD OB MATERNITY 4.3X12.25 (PERSONAL CARE ITEMS) ×3 IMPLANT
SUT ETHIBOND NAB CT1 #1 30IN (SUTURE) IMPLANT
SUT VIC AB 0 CT1 27 (SUTURE) ×2
SUT VIC AB 0 CT1 27XCR 8 STRN (SUTURE) ×4 IMPLANT
SUT VIC AB 0 CT1 36 (SUTURE) IMPLANT
SUT VIC AB 1 CT1 36 (SUTURE) IMPLANT
SUT VIC AB 2-0 CT1 (SUTURE) IMPLANT
SUT VIC AB 2-0 CT1 27 (SUTURE) ×1
SUT VIC AB 2-0 CT1 TAPERPNT 27 (SUTURE) ×2 IMPLANT
SYR BULB IRRIGATION 50ML (SYRINGE) ×3 IMPLANT
SYRINGE 10CC LL (SYRINGE) ×3 IMPLANT
TOWEL OR 17X24 6PK STRL BLUE (TOWEL DISPOSABLE) ×6 IMPLANT
TRAY FOLEY CATH SILVER 14FR (SET/KITS/TRAYS/PACK) ×3 IMPLANT
TRENDGUARD 600 HYBRID PROC PK (MISCELLANEOUS)

## 2017-01-16 NOTE — Anesthesia Postprocedure Evaluation (Addendum)
Anesthesia Post Note  Patient: Haley Martinez  Procedure(s) Performed: Procedure(s) (LRB): HYSTERECTOMY VAGINAL (Bilateral) CYSTOSCOPY (N/A) BILATERAL SALPINGECTOMY (Bilateral)  Patient location during evaluation: PACU Anesthesia Type: General Level of consciousness: awake and alert Pain management: pain level controlled Vital Signs Assessment: post-procedure vital signs reviewed and stable Respiratory status: spontaneous breathing, nonlabored ventilation, respiratory function stable and patient connected to nasal cannula oxygen Cardiovascular status: blood pressure returned to baseline and stable Postop Assessment: no signs of nausea or vomiting Anesthetic complications: no Comments: Patient had some arrhythmia noted on monitor. 12 lead EKG obtained which showed normal sinus rhythm with non specific ST-T wave changes.         Last Vitals:  Vitals:   01/16/17 1545 01/16/17 1600  BP: 126/90 120/87  Pulse: 77 74  Resp: 12 10  Temp:      Last Pain:  Vitals:   01/16/17 1600  TempSrc:   PainSc: 5    Pain Goal: Patients Stated Pain Goal: 3 (01/16/17 1600)               Izela Altier A.

## 2017-01-16 NOTE — H&P (Signed)
Obstetrics & Gynecology H&P   Date of Surgery: 01/16/2017   Primary OBGYN: [facility] Primary Care Provider: Lorayne Marek  CC: for scheduled surgery  History of Present Illness: Haley Martinez is a 36 y.o. [id number] (Patient's last menstrual period was 12/18/2016 (exact date).), with the above CC. PMHx is significant for fibroids, h/o anemia and blood transfusion, h/o roux en y GB.    Patient with history of progressively more painful and heavier periods. Patient last seen in early march 2018 and u/s scheduled. Options d/w her and she desired to proceed with hysterectomy.  ROS: A 12-point review of systems was performed and negative, except as stated in the above HPI.  OBGYN History: As per HPI. OB History  Gravida Para Term Preterm AB Living  3 1 1  0 2 1  SAB TAB Ectopic Multiple Live Births  2 0 0 0 1    # Outcome Date GA Lbr Len/2nd Weight Sex Delivery Anes PTL Lv  3 Term 07/04/99    F Vag-Spont     2 SAB           1 SAB               Nothing for contraception Pap neg and hpv neg 2015   Past Medical History: Past Medical History:  Diagnosis Date  . Anemia   . GERD (gastroesophageal reflux disease)   . Headache 05/22/2014  . History of blood transfusion 04/2014   [facility] - both iron and blood transfusion  . Insomnia   . Polycystic ovarian syndrome   . Vaginal delivery 2000    Past Surgical History: Past Surgical History:  Procedure Laterality Date  . BREAST CYST ASPIRATION Right   . GASTRIC BYPASS  2004  . WISDOM TOOTH EXTRACTION      Family History:  Family History  Problem Relation Age of Onset  . Heart disease Mother   . Hyperlipidemia Mother   . Hypertension Mother   . Hyperlipidemia Father   . Hypertension Father   . Cancer Maternal Uncle     pancreatic ca  . Drug abuse Maternal Grandmother   . Drug abuse Paternal Grandmother   . Stroke Paternal Grandfather    She denies any female cancers, bleeding or blood clotting disorders.    Social History:  Social History   Social History  . Marital status: Single    Spouse name: N/A  . Number of children: N/A  . Years of education: N/A   Occupational History  . Not on file.   Social History Main Topics  . Smoking status: Never Smoker  . Smokeless tobacco: Never Used  . Alcohol use 8.4 oz/week    14 Glasses of wine per week  . Drug use: No  . Sexual activity: Yes    Birth control/ protection: None   Other Topics Concern  . Not on file   Social History Narrative  . No narrative on file   Allergy: Allergies  Allergen Reactions  . Trazodone And Nefazodone     "severe headache"    Current Outpatient Medications: None  Hospital Medications: Current Facility-Administered Medications  Medication Dose Route Frequency Provider Last Rate Last Dose  . 0.9 %  sodium chloride infusion   Intravenous Continuous Aletha Halim, MD      . ceFAZolin (ANCEF) IVPB 2g/100 mL premix  2 g Intravenous Once Aletha Halim, MD      . lactated ringers infusion   Intravenous Continuous Josephine Igo, MD      . [  START ON 01/17/2017] scopolamine (TRANSDERM-SCOP) 1 MG/3DAYS 1.5 mg  1 patch Transdermal Once Josephine Igo, MD   1.5 mg at 01/16/17 1137     Physical Exam:  Current Vital Signs 24h Vital Sign Ranges  T 98.5 F (36.9 C) Temp  Avg: 98.5 F (36.9 C)  Min: 98.5 F (36.9 C)  Max: 98.5 F (36.9 C)  BP 124/83 BP  Min: 124/83  Max: 124/83  HR 74 Pulse  Avg: 74  Min: 74  Max: 74  RR 18 Resp  Avg: 18  Min: 18  Max: 18  SaO2 99 % Not Delivered SpO2  Avg: 99 %  Min: 99 %  Max: 99 %       24 Hour I/O Current Shift I/O  Time Ins Outs No intake/output data recorded. No intake/output data recorded.   General appearance: Well nourished, well developed female in no acute distress.  Neck:  Supple, normal appearance, and no thyromegaly  Cardiovascular: S1, S2 normal, no murmur, rub or gallop, regular rate and rhythm Respiratory:  Clear to auscultation bilateral. Normal  respiratory effort Abdomen: positive bowel sounds and no masses, hernias; diffusely non tender to palpation, non distended Neuro/Psych:  Normal mood and affect.  Skin:  Warm and dry.  Extremities: no clubbing, cyanosis, or edema.  Lymphatic:  No inguinal lymphadenopathy.   Pelvic exam from 3/8 Pelvic exam: is not limited by body habitus EGBUS: within normal limits, Vagina: within normal limits and with no blood or discharge in the vault, Cervix: normal appearing cervix without tenderness, discharge or lesions. Uterus:  enlarged, c/w 10-12 week size and non tender, mobile, mild to moderate decensus, and Adnexa:  normal adnexa and no mass, fullness, tenderness Rectovaginal: deferred   Laboratory: UPT: negative CBC Latest Ref Rng & Units 01/05/2017 10/30/2016 10/08/2014  WBC 4.0 - 10.5 K/uL 4.0 5.4 3.1(L)  Hemoglobin 12.0 - 15.0 g/dL 10.3(L) - 11.5(L)  Hematocrit 36.0 - 46.0 % 33.2(L) 31.9(L) 36.5  Platelets 150 - 400 K/uL 386 327 241   CMP Latest Ref Rng & Units 01/05/2017 05/29/2014 05/28/2014  Glucose 65 - 99 mg/dL 84 97 160(H)  BUN 6 - 20 mg/dL 10 5(L) 6  Creatinine 0.44 - 1.00 mg/dL 0.65 0.62 0.58  Sodium 135 - 145 mmol/L 137 135(L) 138  Potassium 3.5 - 5.1 mmol/L 3.8 4.4 4.6  Chloride 101 - 111 mmol/L 105 101 104  CO2 22 - 32 mmol/L 25 22 22   Calcium 8.9 - 10.3 mg/dL 8.8(L) 8.6 8.6  Total Protein 6.5 - 8.1 g/dL 7.6 6.5 6.7  Total Bilirubin 0.3 - 1.2 mg/dL 0.8 1.0 1.2  Alkaline Phos 38 - 126 U/L 54 65 56  AST 15 - 41 U/L 19 86(H) 40(H)  ALT 14 - 54 U/L 9(L) 78(H) 31    Imaging:  CLINICAL DATA:  Menorrhagia, fibroids  EXAM: TRANSABDOMINAL AND TRANSVAGINAL ULTRASOUND OF PELVIS  TECHNIQUE: Both transabdominal and transvaginal ultrasound examinations of the pelvis were performed. Transabdominal technique was performed for global imaging of the pelvis including uterus, ovaries, adnexal regions, and pelvic cul-de-sac. It was necessary to proceed with endovaginal exam following  the transabdominal exam to visualize the uterus.  COMPARISON:  10/30/2014  FINDINGS: Uterus  Measurements: 9.5 x 6.1 x 7.0 cm. 3.8 cm posterior right intramural fibroid, not significantly changed.  Endometrium  Thickness: Normal thickness, 9 mm.  No focal abnormality visualized.  Right ovary  Measurements: 4.8 x 2.8 x 3.2 cm. Normal appearance/no adnexal mass.  Left ovary  Measurements: 3.6  x 2.2 x 3.2 cm. Normal appearance/no adnexal mass.  Other findings  No abnormal free fluid.  IMPRESSION: 3.8 cm right posterior intramural fundal fibroid. No acute findings.   Electronically Signed   By: Rolm Baptise M.D.   On: 12/07/2016 12:35  Assessment: Haley Martinez is a 36 y.o. [id number] (Patient's last menstrual period was 12/18/2016 (exact date).) here for scheduled surgery; pt doing well   Plan: D/w pt again and she desires to proceed with hysterectomy after d/w pt re: r/b/a. Patient consented for TVH/possible bilateral salpingectomy/possible cystoscopy/possible ex-lap.   Can proceed when the OR is ready.   Durene Romans MD Attending Center for [facility])

## 2017-01-16 NOTE — Anesthesia Procedure Notes (Signed)
Procedure Name: Intubation Date/Time: 01/16/2017 12:44 PM Performed by: Riki Sheer Pre-anesthesia Checklist: Patient identified, Emergency Drugs available, Suction available, Patient being monitored and Timeout performed Patient Re-evaluated:Patient Re-evaluated prior to inductionOxygen Delivery Method: Circle system utilized Preoxygenation: Pre-oxygenation with 100% oxygen Intubation Type: IV induction Ventilation: Mask ventilation without difficulty Laryngoscope Size: Miller and 2 Grade View: Grade I Tube type: Oral Tube size: 7.0 mm Number of attempts: 1 Airway Equipment and Method: Stylet Placement Confirmation: ETT inserted through vocal cords under direct vision,  positive ETCO2,  CO2 detector and breath sounds checked- equal and bilateral Secured at: 21 cm Tube secured with: Tape Dental Injury: Teeth and Oropharynx as per pre-operative assessment

## 2017-01-16 NOTE — Anesthesia Preprocedure Evaluation (Addendum)
Anesthesia Evaluation  Patient identified by MRN, date of birth, ID band Patient awake    Reviewed: Allergy & Precautions, NPO status , Patient's Chart, lab work & pertinent test results  Airway Mallampati: II  TM Distance: >3 FB     Dental no notable dental hx. (+) Chipped,    Pulmonary neg pulmonary ROS,    Pulmonary exam normal breath sounds clear to auscultation       Cardiovascular negative cardio ROS Normal cardiovascular exam Rhythm:Regular Rate:Normal     Neuro/Psych  Headaches, negative psych ROS   GI/Hepatic Neg liver ROS, GERD  Medicated and Controlled,Hx/o gastric bypass   Endo/Other    Renal/GU negative Renal ROS  negative genitourinary   Musculoskeletal negative musculoskeletal ROS (+)   Abdominal   Peds  Hematology  (+) anemia , Vit. B12 and Fe deficiency Hx/o blood transfusion   Anesthesia Other Findings   Reproductive/Obstetrics AUB Menorrhagia Uterine leiomyoma                            Anesthesia Physical Anesthesia Plan  ASA: II  Anesthesia Plan: General   Post-op Pain Management:    Induction: Intravenous  Airway Management Planned: Oral ETT  Additional Equipment:   Intra-op Plan:   Post-operative Plan: Extubation in OR  Informed Consent: I have reviewed the patients History and Physical, chart, labs and discussed the procedure including the risks, benefits and alternatives for the proposed anesthesia with the patient or authorized representative who has indicated his/her understanding and acceptance.   Dental advisory given  Plan Discussed with: Anesthesiologist, CRNA and Surgeon  Anesthesia Plan Comments:         Anesthesia Quick Evaluation

## 2017-01-16 NOTE — Transfer of Care (Signed)
Immediate Anesthesia Transfer of Care Note  Patient: Haley Martinez  Procedure(s) Performed: Procedure(s): HYSTERECTOMY VAGINAL (Bilateral) CYSTOSCOPY (N/A) BILATERAL SALPINGECTOMY (Bilateral)  Patient Location: PACU  Anesthesia Type:General  Level of Consciousness: awake, alert  and oriented  Airway & Oxygen Therapy: Patient Spontanous Breathing and Patient connected to nasal cannula oxygen  Post-op Assessment: Report given to RN and Post -op Vital signs reviewed and stable  Post vital signs: Reviewed and stable  Last Vitals:  Vitals:   01/16/17 1134  BP: 124/83  Pulse: 74  Resp: 18  Temp: 36.9 C    Last Pain:  Vitals:   01/16/17 1134  TempSrc: Oral      Patients Stated Pain Goal: 3 (26/37/85 8850)  Complications: No apparent anesthesia complications

## 2017-01-16 NOTE — Addendum Note (Signed)
Addendum  created 01/16/17 1731 by Josephine Igo, MD   Sign clinical note

## 2017-01-16 NOTE — Op Note (Addendum)
Operative Note   01/16/2017  PRE-OP DIAGNOSIS: Menorrhagia. Fibroid uterus   POST-OP DIAGNOSIS: Same   SURGEON: Surgeon(s) and Role:    * Aletha Halim, MD - Primary  ASSISTANT:    * Chancy Milroy, MD - Assisting  ANESTHESIA: General and local  PROCEDURE: Total vaginal hysterectomy, bilateral salpingectomy, cystoscopy  ESTIMATED BLOOD LOSS: 14mL  DRAINS: 132mL UOP via indwelling foley   TOTAL IV FLUIDS: 1873mL crystalloid  SPECIMENS:cervix, uterus, and bilateral fallopian tubes to pathology  VTE PROPHYLAXIS: SCDs to the bilateral lower extremities  ANTIBIOTICS: Two grams of Cefazolin were given within one hour of incision  COMPLICATIONS: None  DISPOSITION: PACU - hemodynamically stable.  CONDITION: stable  FINDINGS: Exam under anesthesia revealed an approximately 10 week size anteverted uterus that was relatively mobile with good decensus.  Normal cervix and uterus with 3-4cm posterior, fundal intramural fibroid noted (overall uterus approximately 8-10wk sized), normal fallopian tubes. 4-5cm diffusely enlarged bilateral ovaries consistent with PCOS (no lone, solitary cyst seen causing the enlargement). Slight cystocele. Normal bladder and dome with normal appearing UOs and +jets from each.   PROCEDURE IN DETAIL: The patient was taken to the operating room where general anesthesia was administered. An exam under anesthesia was performed with the above-noted findings. She was then prepped and draped in the usual sterile fashion in the dorsal lithotomy position with the Allen stirrups and a foley catheter placed.   A weighted speculum was placed in the vagina and a Deaver placed anteriorly.  The cervix was grasped with two single-tooth tenaculums. Next, the cervical vaginal epithelium was injected with lidocaine with epinephrine and then incised in a circumferential fashion around the cervico-vaginal junction. A posterior colpotomy incision was made with Mayo scissors and the  rectovaginal space was entered. The weighted speculum was then replaced. In a sequential fashion the uterosacral ligaments, the cardinal ligaments and the uterine arteries were clamped, transected, and suture ligated with 0 Vicryl. During this, the pubovesical cervical fascia was incised with the Metzenbaum scissors and the bladder mobilized cephalad. The peritoneum was identified and entered sharply with Metzenbaum scissors and the retractor placed into the peritoneal space to retract the bladder anteriorly. The anterior and posterior broad ligaments on either side of the uterus were serially clamped, transected, and suture ligated with 0 vicryl until the utero-ovarian ligaments were encountered bilaterally. These were cross-clamped, transected, and doubly suture ligated with a transfixion stitch of 0 Vicryl bilaterally.  The bilateral tubes were then identified and grasped with the babcock clamps and the mesosalpinx clamped wit the Kellys, cut and suture ligated with a free tie and then a stitch of 0 Vicryl.  Excellent hemostasis was noted.  The vaginal cuff was then closed using 0 Vicryl in a running locking fashion with the tagged uterosacral and cardinal ligaments used in the cuff closure. Excellent hemostasis was noted. Next, cystoscopy was performed with the above noted findings.The vagina was then packed with Kerlix soaked in saline.  The patient tolerated the procedure well. All sponge, lap, needle, and instrument counts were correct x2. She was taken to the recovery room in stable condition.  Durene Romans MD Attending Center for [employer] [facility] [employer] [facility])

## 2017-01-17 ENCOUNTER — Encounter (HOSPITAL_COMMUNITY): Payer: Self-pay | Admitting: Obstetrics and Gynecology

## 2017-01-17 LAB — CBC
HCT: 27.5 % — ABNORMAL LOW (ref 36.0–46.0)
HEMOGLOBIN: 8.9 g/dL — AB (ref 12.0–15.0)
MCH: 25.3 pg — AB (ref 26.0–34.0)
MCHC: 32.4 g/dL (ref 30.0–36.0)
MCV: 78.1 fL (ref 78.0–100.0)
PLATELETS: 284 10*3/uL (ref 150–400)
RBC: 3.52 MIL/uL — ABNORMAL LOW (ref 3.87–5.11)
RDW: 18.9 % — ABNORMAL HIGH (ref 11.5–15.5)
WBC: 12.7 10*3/uL — ABNORMAL HIGH (ref 4.0–10.5)

## 2017-01-17 MED ORDER — OXYCODONE-ACETAMINOPHEN 5-325 MG PO TABS: 1.0000 | ORAL_TABLET | Freq: Four times a day (QID) | ORAL | 0 refills | Status: DC | PRN

## 2017-01-17 MED ORDER — FERROUS GLUCONATE 324 (38 FE) MG PO TABS: 324.0000 mg | ORAL_TABLET | Freq: Two times a day (BID) | ORAL | 0 refills | Status: DC

## 2017-01-17 MED ORDER — POLYETHYLENE GLYCOL 3350 17 G PO PACK: PACK | ORAL | 1 refills | Status: DC

## 2017-01-17 MED ORDER — BENZOCAINE-MENTHOL 20-0.5 % EX AERO
1.0000 "application " | INHALATION_SPRAY | Freq: Four times a day (QID) | CUTANEOUS | Status: DC | PRN
  Filled 2017-01-17: qty 56

## 2017-01-17 MED ORDER — FERROUS GLUCONATE 324 (38 FE) MG PO TABS: 324.0000 mg | ORAL_TABLET | Freq: Two times a day (BID) | ORAL | Status: DC

## 2017-01-17 MED FILL — OXYCODONE W/APAP 5/325 TAB: 5-325 | 4 days supply | Qty: 15 | Fill #0

## 2017-01-17 NOTE — Discharge Summary (Signed)
Gynecology Discharge Summary Date of Admission: 01/16/2017 Date of Discharge: 01/17/2017  The patient was admitted, as scheduled, and underwent a TVH/BS/cystoscopy; please refer to operative note for full details.  She was meeting all post op goals and discharged to home on POD#1  Allergies as of 01/17/2017      Reactions   Trazodone And Nefazodone    "severe headache"      Medication List    STOP taking these medications   Polysaccharide Iron Complex 15 MG/0.5ML Liqd   ranitidine 75 MG tablet Commonly known as:  ZANTAC     TAKE these medications   amitriptyline 25 MG tablet Commonly known as:  ELAVIL Take 1 tablet (25 mg total) by mouth at bedtime.   esomeprazole 40 MG packet Commonly known as:  NEXIUM Take 40 mg by mouth daily before breakfast.   ferrous gluconate 324 MG tablet Commonly known as:  FERGON Take 1 tablet (324 mg total) by mouth 2 (two) times daily with a meal. Start taking on:  01/18/2017   ibuprofen 200 MG tablet Commonly known as:  ADVIL,MOTRIN Take 600 mg by mouth every 8 (eight) hours as needed (for  pain/headaches.).   oxyCODONE-acetaminophen 5-325 MG tablet Commonly known as:  PERCOCET/ROXICET Take 1 tablet by mouth every 6 (six) hours as needed for severe pain.   polyethylene glycol packet Commonly known as:  MIRALAX / GLYCOLAX One dose PO qday x 7 days and then daily prn constipation   Vitamin D (Ergocalciferol) 50000 units Caps capsule Commonly known as:  DRISDOL Take 1 capsule (50,000 Units total) by mouth every 7 (seven) days.       Future Appointments Date Time Provider [place]  02/14/2017 9:40 AM Aletha Halim, MD [facility]    Durene Romans MD Attending Center for [facility])

## 2017-01-17 NOTE — Progress Notes (Signed)
Gynecology Progress Note  Admission Date: 01/16/2017 Current Date: 01/17/2017 8:14 AM  Haley Martinez is a 36 y.o. [id number] POD#1 TVH/BS/cysto for AUB, fibroids (EBL [id number])   History complicated by: Patient Active Problem List   Diagnosis Date Noted  . Status post hysterectomy 01/16/2017  . Menorrhagia.  11/30/2016  . History of prediabetes 10/30/2016  . Breast mass, right 10/30/2016  . Uterine leiomyoma 10/30/2016  . Thrombocytosis ([place]) 05/24/2014  . Headache 05/22/2014  . Iron deficiency anemia 05/15/2014  . Other chest pain 05/15/2014  . Vitamin D deficiency 05/14/2014  . Gastric bypass status for obesity 04/17/2014  . B12 deficiency anemia 09/09/2007  . Insomnia 08/02/2007    ROS and patient/family/surgical history, located on admission H&P note dated 01/16/2017, have been reviewed, and there are no changes except as noted below Yesterday/Overnight Events:  None  Subjective:  Patient ambulated, took PO and pain controlled with PO meds. No flatus, nausea, vomiting.   Objective:    Current Vital Signs 24h Vital Sign Ranges  T 98 F (36.7 C) Temp  Avg: 98.1 F (36.7 C)  Min: 97.7 F (36.5 C)  Max: 98.5 F (36.9 C)  BP 116/72 BP  Min: 116/72  Max: 138/72  HR (!) 56 Pulse  Avg: 75.3  Min: 56  Max: 93  RR 16 Resp  Avg: 15.1  Min: 10  Max: 29  SaO2 98 % Not Delivered SpO2  Avg: 98 %  Min: 93 %  Max: 100 %       24 Hour I/O Current Shift I/O  Time Ins Outs 04/24 0701 - 04/25 0700 In: 0258 [I.V.:3565] Out: 5277 [Urine:1300] No intake/output data recorded.   Patient Vitals for the past 12 hrs:  BP Temp Temp src Pulse Resp SpO2  01/17/17 0325 116/72 98 F (36.7 C) Oral (!) 56 16 98 %  01/16/17 2344 123/72 98.1 F (36.7 C) Oral 61 18 98 %  01/16/17 2028 120/81 97.7 F (36.5 C) Axillary 77 18 100 %    Physical exam: General appearance: alert, cooperative and appears stated age Abdomen: +BS, soft, nttp, nd GU: No gross VB. Vag packing and foley removed (approx  151mL clear UOP in bag) Lungs: clear to auscultation bilaterally Heart: S1, S2 normal, no murmur, rub or gallop, regular rate and rhythm Extremities: SCDs on Skin: warm and dry Psych: appropriate Neurologic: Grossly normal  Medications Current Facility-Administered Medications  Medication Dose Route Frequency Provider Last Rate Last Dose  . 0.9 %  sodium chloride infusion   Intravenous Continuous Aletha Halim, MD   Stopped at 01/17/17 [phone]  . acetaminophen (TYLENOL) tablet 650 mg  650 mg Oral Q4H PRN Aletha Halim, MD      . Derrill Memo ON 01/18/2017] ferrous gluconate (FERGON) tablet 324 mg  324 mg Oral BID WC Aletha Halim, MD      . ibuprofen (ADVIL,MOTRIN) tablet 600 mg  600 mg Oral Q6H Aletha Halim, MD   600 mg at 01/17/17 0801  . ondansetron (ZOFRAN) tablet 4 mg  4 mg Oral Q6H PRN Aletha Halim, MD       Or  . ondansetron (ZOFRAN) injection 4 mg  4 mg Intravenous Q6H PRN Aletha Halim, MD      . oxyCODONE (Oxy IR/ROXICODONE) immediate release tablet 5-10 mg  5-10 mg Oral Q4H PRN Aletha Halim, MD   10 mg at 01/17/17 0620  . pantoprazole (PROTONIX) EC tablet 40 mg  40 mg Oral Daily Aletha Halim, MD      .  polyethylene glycol (MIRALAX / GLYCOLAX) packet 17 g  17 g Oral Daily Aletha Halim, MD      . simethicone [facility]) chewable tablet 80 mg  80 mg Oral TID AC Aletha Halim, MD   80 mg at 01/17/17 0801   Labs   Recent Labs Lab 01/17/17 0546  WBC 12.7*  HGB 8.9*  HCT 27.5*  PLT 284    Radiology none  Assessment & Plan:  Pt doing well *GYN: routine post op care. Follow up void and can d/c to home after that *Pain: controlled with PO meds *FEN/GI: regular diet. Saline lock IV *Anemia: no current s/s. Follow up now packing and foley is out and ambulating more. Continue bid iron *PPx: PPI, OOB ad lib, incentive spirometry *Dispo: see above. Likely later this morning.   Code Status: Full Code  Durene Romans MD Attending Center for [facility])

## 2017-01-17 NOTE — Progress Notes (Signed)
Discharge teaching complete with pt. Pt understood all information and did not have any questions. Pt discharged home to family. 

## 2017-01-17 NOTE — Discharge Instructions (Signed)
°  Instructions Following Major Surgery You have just undergone a major surgery.  The following list should answer your most common questions.  Although we will discuss your surgery and post-operative instructions with you prior to your discharge, this list will serve as a reminder if you fail to recall the details of what we discussed.  We will discuss your surgery once again in detail at your post-op visit in two to four weeks. If you havent already done so, please call to make your appointment as soon as possible.  How you will feel: Although you have just undergone a major surgery, your recovery will be significantly shorter since the surgery was performed vaginally.  You should feel slightly better each day.  If you suddenly feel much worse than the prior day, please call the clinic.  Its important during the early part of your recovery that you maintain some activity.  Walking is encouraged.  You will quicken your recovery by continued activity.  Vaginal Discharge Following a Hysterectomy: Minor vaginal bleeding or spotting is normal following a hysterectomy.  Bleeding similar to the amount of your period is excessive, and you should inform us of this immediately.  Vaginal spotting may continue for several weeks following your surgery.  You may notice a yellowish discharge which occasionally occurs as the vaginal stitches dissolve, and may last for several weeks, but please call us or come to the ER if there is a foul smelling discharge  Sexual Activity Following a Hysterectomy: Do not have sexual intercourse or place tampons or douches in the vagina prior to your first office visit.  We will discuss when you may resume these activities at that visit.    Stairs/Driving/Activities: You may climb stairs if necessary.  If youve had general anesthesia, do not drive a car the rest of the day today.  You may begin light housework when you feel up to it, but avoid heavy lifting (more than 15-20lbs) or  pushing until cleared for these activities by your physician.  Hygiene:  Do not soak your incisions.  Showers are acceptable but you may not take a bath or swim in a pool.  Cleanse your incisions daily with soap and water.  Medications:  Please resume taking any medications that you were taking prior to the surgery.  If we have prescribed any new medications for you, please take them as directed.  Constipation:  It is fairly common to experience some difficulty in moving your bowels following major surgery.  Being active will help to reduce this likelihood. A diet rich in fiber and plenty of liquids is desirable.  If you do become constipated, a mild laxative such as Miralax, Milk of Magnesia, or Metamucil, or a stool softener such as Colace, is recommended.  General Instructions: No lifting more than 15 pounds until cleared by your doctor. If you develop a fever of 100.5 degrees or higher, please call the office number(s) below for physician on call.

## 2017-01-18 ENCOUNTER — Encounter (HOSPITAL_COMMUNITY): Payer: Self-pay

## 2017-01-30 ENCOUNTER — Telehealth: Payer: Self-pay | Admitting: *Deleted

## 2017-01-30 NOTE — Telephone Encounter (Addendum)
Pt left message stating that she had a hysterectomy 3 weeks ago and wants a note from Dr. Ilda Basset stating that she can return to work for 1/2 days.  She said that she tried it yesterday and it went pretty well except for some back pain. Also, she has been having trouble sleeping and is not resting well.  *Per chart review, pt had surgery on 4/24.  5/9  0745  Pt left additional message yesterday @ 1640 stating that she may need to come in to see Dr. Ilda Basset. I called pt this morning and left message stating that we received her messages and I will be talking with Dr. Ilda Basset this afternoon when he is in the office regarding her work letter request and concern for difficult sleep. If she prefers to see him @ the office, please call back to the appt line and schedule appt.

## 2017-01-31 ENCOUNTER — Telehealth: Payer: Self-pay | Admitting: Obstetrics and Gynecology

## 2017-01-31 MED ORDER — ZOLPIDEM TARTRATE 5 MG PO TABS: 5.0000 mg | ORAL_TABLET | Freq: Every evening | ORAL | 0 refills | Status: DC | PRN

## 2017-01-31 NOTE — Telephone Encounter (Addendum)
GYN Telephone Note  Patient called at [phone] b/c she'd like to go back to work and she's had some difficulty sleeping for past few days (feeling restless), and some irritation in the GU area.   She works at [employer] and I told her that as long as no lifting more than 15lbs and she takes it easy that it's fine if she'd like to go back to work. She's having some spotting and minimal lower belly discomfort  She's tried benadryl and an Rx of amitriptyline that she's used in the past but that didn't help; she stopped that last week. I offered her a course of ambien (she's not needing to take the narcotics anymore) and she was amenable to that.   No itching, LUTs s/s, fevers, chills, nausea, vomiting, malodorous urine. I told her that it may be a yeast infection and to try OTC tx for that  Pt to come by and pick up [place] [facility].   Durene Romans MD Attending Center for [facility] (Faculty Practice) 01/31/2017 Time: [phone]

## 2017-02-07 NOTE — Telephone Encounter (Signed)
Called pt after consult with Dr. Ilda Basset.  I left a message stating that he does not want to prescribe anything stronger without her having an examination. I questioned if she can say why she is having the insomnia, ie: pain, etc. I also stated that she can use OTC Melatonin in addition to the Ambien which was prescribed. I asked her to please call back with additional information or let us know if she would like a different appt. He will be back in our office on 5/21.

## 2017-02-07 NOTE — Telephone Encounter (Signed)
Patient left a message on the nurse voicemail on 02/07/17 at [place].  States she had a hysterectomy with Dr. Ilda Basset on 01/16/17.  States she has insomnia really bad.  States Dr. Ilda Basset had originally ordered her 58 Lorrin Mais but they didn't help.  She wants to know if he will call in something stronger so she can get some good rest.  Requests a return call to [phone].

## 2017-02-08 NOTE — Telephone Encounter (Signed)
Received message left on nurse voicemail on 02/07/17 at 1518.  Patient states Dr. Ilda Basset gave her only 52 ambien and she is out now.  States she has an appointment with Dr. Ilda Basset next Thursday and she needs to confirm the time.  States she thinks she may have done too much too fast.  When she feels good she says she overdoes it and then is in pain.  States she is trying to manage pain with ibuprofen and is pretty much taking it constantly.  States she suffers from insomnia and maybe that has something to do with it to.  States she doesn't take melatonin as it has given her headaches in the past.  The message then cut off.

## 2017-02-08 NOTE — Telephone Encounter (Signed)
Called pt and discussed her concerns.  She re-iterated that same information from her most recent telephone message with the addition of stating that she is experiencing a pulling sensation in her vaginal rectal area. She denies severe pain and states that she is taking ibuprofen to manage pain. Sometimes she forgets to take the medication because she is feeling good and then "over does it with activity causing more pain to occur".  Pt further states that she went back to work for half days and this was approved by Dr. 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 I advised pt to really take it easy with activity when she is not at work and that should be her primary amount of activity. She was encouraged to discuss all of her concerns @ visit on 5/23. Pt agreed and voiced understanding.

## 2017-02-14 ENCOUNTER — Encounter: Payer: Self-pay | Admitting: Obstetrics and Gynecology

## 2017-02-14 ENCOUNTER — Ambulatory Visit (INDEPENDENT_AMBULATORY_CARE_PROVIDER_SITE_OTHER): Payer: Self-pay | Admitting: Obstetrics and Gynecology

## 2017-02-14 VITALS — BP 129/73 | HR 97 | Wt 164.9 lb

## 2017-02-14 DIAGNOSIS — B373 Candidiasis of vulva and vagina: Secondary | ICD-10-CM

## 2017-02-14 DIAGNOSIS — B9689 Other specified bacterial agents as the cause of diseases classified elsewhere: Secondary | ICD-10-CM

## 2017-02-14 DIAGNOSIS — B3731 Acute candidiasis of vulva and vagina: Secondary | ICD-10-CM

## 2017-02-14 DIAGNOSIS — Z09 Encounter for follow-up examination after completed treatment for conditions other than malignant neoplasm: Secondary | ICD-10-CM

## 2017-02-14 DIAGNOSIS — N760 Acute vaginitis: Secondary | ICD-10-CM

## 2017-02-14 MED ORDER — FLUCONAZOLE 150 MG PO TABS: 150.0000 mg | ORAL_TABLET | Freq: Once | ORAL | 0 refills | Status: AC

## 2017-02-14 MED ORDER — MICONAZOLE NITRATE 2 % VA CREA: 1.0000 | TOPICAL_CREAM | Freq: Every day | VAGINAL | 0 refills | Status: DC

## 2017-02-14 MED ORDER — METRONIDAZOLE 500 MG PO TABS: 500.0000 mg | ORAL_TABLET | Freq: Two times a day (BID) | ORAL | 0 refills | Status: DC

## 2017-02-14 NOTE — Progress Notes (Addendum)
Obstetrics and Gynecology Visit Return Patient Evaluation  Appointment Date: 02/14/2017  Primary Care Provider: System, Pcp Not In  Chief Complaint: regular post op check  History of Present Illness:  Haley Martinez is a 36 y.o. s/p 4/24 TVH/BS/cysto. She was discharged to home on POD#1. Final pathology negative.   She still has some vaginal discharge but no VB. 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 She's had nothing per vagina. She also states that she can fall asleep fine but wakes up in the middle of the night and has difficulty going to sleep; she states this pre dates her surgery.   Review of Systems:  as noted in the History of Present Illness.  Medications: PRN motrin, OTC nyquil Allergies: is allergic to trazodone and nefazodone.  Physical Exam:  BP 129/73   Pulse 97   Wt 164 lb 14.4 oz (74.8 kg)   LMP 12/18/2016 (Exact Date)   BMI 27.02 kg/m  Body mass index is 27.02 kg/m. General appearance: Well nourished, well developed female in no acute distress.  Abdomen: diffusely non tender to palpation, non distended, and no masses, hernias Neuro/Psych:  Normal mood and affect.    Pelvic exam:  EGBUS with white cottage cheese like d/c Vaginal vault with above d/c and white/yellowish d/c. Cuff intact with sutures still present. Cuff nttp and palpates normally. No masses felt   Assessment: pt doing well  Plan: Flagyl and diflucan or monistat given to patient  D/w her that she is healing well and that normally people will take a month off before going back to work so she may be pushing herself too much but I told her that her s/s should get better and resolve with time. Also d/w pt to do a complete six weeks of pelvic rest before being released from it.   I also d/w her that sometimes life stressors can cause issues with sleep especially if she  going to sleep fine but wakes up and has difficulty giong back to sleep. D/w her to avoid chronic benadryl usage, which many sleep aids have, b/c it may worsen it and to try melatonin or unisom prn.   RTC: PRN  Durene Romans MD Attending Center for [facility])

## 2017-03-19 ENCOUNTER — Ambulatory Visit (INDEPENDENT_AMBULATORY_CARE_PROVIDER_SITE_OTHER): Payer: Self-pay | Admitting: Internal Medicine

## 2017-03-19 VITALS — BP 113/66 | HR 80 | Temp 98.0°F | Wt 170.3 lb

## 2017-03-19 DIAGNOSIS — K029 Dental caries, unspecified: Secondary | ICD-10-CM

## 2017-03-19 NOTE — Progress Notes (Signed)
   CC: Dental problems  HPI:  [name] is a 36 y.o. woman here with left lower mouth pain who noticed new cavities in her teeth.   See problem based assessment and plan below for additional details  Past Medical History:  Diagnosis Date  . Anemia   . GERD (gastroesophageal reflux disease)   . Headache 05/22/2014  . History of blood transfusion 04/2014   [facility] - both iron and blood transfusion  . Insomnia   . Polycystic ovarian syndrome   . Vaginal delivery 2000    Review of Systems:  Review of Systems  Constitutional: Negative for chills and fever.  HENT: Negative for ear pain and sore throat.   Neurological: Negative for speech change.    Physical Exam: Physical Exam  Constitutional: She is well-developed, well-nourished, and in no distress.  HENT:  Mouth/Throat: No oropharyngeal exudate.  Dental cavity along gum line on left lower first molar Smaller cavities also present on one right upper molar and lower molar  Cardiovascular: Normal rate and regular rhythm.   Pulmonary/Chest: Effort normal and breath sounds normal.  Lymphadenopathy:    She has no cervical adenopathy.    Vitals:   03/19/17 1535  BP: 113/66  Pulse: 80  Temp: 98 F (36.7 C)  TempSrc: Oral  SpO2: 100%  Weight: 170 lb 4.8 oz (77.2 kg)     Assessment & Plan:   See Encounters Tab for problem based charting.  Patient discussed with Dr. Daryll Drown

## 2017-03-19 NOTE — Patient Instructions (Signed)
It was a pleasure to see you today Haley Martinez.  You have several dental caries (cavities). These do not look infected at this time but I would agree with treatment by a dentist for them so we will refer you at this time.  Keep taking care of your health like you have been doing!

## 2017-03-20 NOTE — Progress Notes (Signed)
Internal Medicine Clinic Attending  Case discussed with Dr. Rice at the time of the visit.  We reviewed the resident's history and exam and pertinent patient test results.  I agree with the assessment, diagnosis, and plan of care documented in the resident's note.  

## 2017-03-20 NOTE — Assessment & Plan Note (Signed)
HPI: She is experiencing new left lower tooth pain that is new in the past few weeks. She noticed a dark hole in the teeth on that side which led her to inspect her closely finding additional small abnormalities on the right side. She brushes and flosses her teeth very consistently and does not recall having trouble with them in the past. She has followed up with dentists intermittently but none in the past year. She is not having any difficulty eating or drinking due to the tooth pain.  A: Dental caries There is no evidence of abscess formation or surrounding gingival infection needing antibiotic therapy urgently. The total tooth damage is limited and would probably amenable to filling and repair if addressed before further progression or secondary infection occurs.  P: Referral placed for dental evaluation

## 2017-03-26 ENCOUNTER — Telehealth: Payer: Self-pay | Admitting: *Deleted

## 2017-03-26 NOTE — Telephone Encounter (Signed)
Called patient she would like to come by today to leave a urine sample. She is having some burning after urinating. I advised patient to stop by today and leave a urine sample.

## 2017-03-26 NOTE — Telephone Encounter (Signed)
Pt left message stating that she had hysterectomy 8 weeks ago and has some questions. She states that she had intercourse and had a small amount of bleeding afterwards. She also is having burning with urination. She wants to know if these symptoms are normal.

## 2017-03-27 ENCOUNTER — Ambulatory Visit: Payer: Self-pay

## 2017-03-27 DIAGNOSIS — N390 Urinary tract infection, site not specified: Secondary | ICD-10-CM

## 2017-03-27 LAB — POCT URINALYSIS DIP (DEVICE)
BILIRUBIN URINE: NEGATIVE
GLUCOSE, UA: NEGATIVE mg/dL
Hgb urine dipstick: NEGATIVE
Ketones, ur: NEGATIVE mg/dL
NITRITE: NEGATIVE
Protein, ur: NEGATIVE mg/dL
Specific Gravity, Urine: 1.02 (ref 1.005–1.030)
Urobilinogen, UA: 4 mg/dL — ABNORMAL HIGH (ref 0.0–1.0)
pH: 7 (ref 5.0–8.0)

## 2017-03-29 NOTE — Progress Notes (Signed)
Pt came to drop off urine sample for possible UTI.  Pt advised that we are closed for the holiday tomorrow but will call her once culture resulted.  Pt stated understanding with no further questions.

## 2017-03-31 LAB — URINE CULTURE: Organism ID, Bacteria: NO GROWTH

## 2017-04-02 ENCOUNTER — Ambulatory Visit (INDEPENDENT_AMBULATORY_CARE_PROVIDER_SITE_OTHER): Payer: Self-pay | Admitting: *Deleted

## 2017-04-02 DIAGNOSIS — Z111 Encounter for screening for respiratory tuberculosis: Secondary | ICD-10-CM

## 2017-04-02 DIAGNOSIS — Z9189 Other specified personal risk factors, not elsewhere classified: Secondary | ICD-10-CM

## 2017-04-04 LAB — TB SKIN TEST
Induration: 0 mm
TB SKIN TEST: NEGATIVE

## 2017-04-09 NOTE — Progress Notes (Signed)
Complete, came back wed 7/11, daisy wilkins RN read- negative

## 2017-04-10 NOTE — Addendum Note (Signed)
Addended by: Collier Salina on: 04/10/2017 07:49 AM   Modules accepted: Orders

## 2017-04-19 ENCOUNTER — Ambulatory Visit: Payer: Self-pay

## 2017-04-26 ENCOUNTER — Ambulatory Visit: Payer: Self-pay

## 2017-05-29 ENCOUNTER — Ambulatory Visit (INDEPENDENT_AMBULATORY_CARE_PROVIDER_SITE_OTHER): Payer: Self-pay | Admitting: Medical

## 2017-05-29 ENCOUNTER — Telehealth: Payer: Self-pay | Admitting: General Practice

## 2017-05-29 ENCOUNTER — Encounter: Payer: Self-pay | Admitting: Medical

## 2017-05-29 VITALS — BP 118/76 | HR 74 | Wt 171.6 lb

## 2017-05-29 DIAGNOSIS — N939 Abnormal uterine and vaginal bleeding, unspecified: Secondary | ICD-10-CM

## 2017-05-29 DIAGNOSIS — Z90710 Acquired absence of both cervix and uterus: Secondary | ICD-10-CM

## 2017-05-29 NOTE — Patient Instructions (Signed)
Silver Nitrate topical solution What is this medicine? SILVER NITRATE (SIL ver NYE trate) is a solution applied to skin lesions to prevent or treat infection. This medicine may be used for other purposes; ask your health care provider or pharmacist if you have questions. What should I tell my health care provider before I take this medicine? They need to know if you have any of these conditions: -an unusual or allergic reaction to silver nitrate, other medicines, foods, dyes, or preservatives -pregnant or trying to get pregnant -breast-feeding How should I use this medicine? This medicine is for external use only. Follow the directions on the prescription label. Apply with a cotton applicator dipped in the solution. Do not get this medicine in your eyes. If you do, rinse out with plenty of cool tap water for at least 15 minutes. Finish the full course of medicine prescribed by your doctor or health care professional even if you think your condition is better. Do not stop using except on your doctor's advice. Talk to your pediatrician regarding the use of this medicine in children. Special care may be needed. Overdosage: If you think you have taken too much of this medicine contact a poison control center or emergency room at once. NOTE: This medicine is only for you. Do not share this medicine with others. What if I miss a dose? If you miss a dose, use it as soon as you can. If it is almost time for your next dose, use only that dose. Do not use double or extra doses. What may interact with this medicine? Do not take this medicine with any of the following medications: -collagenase, papain, or sutilains -castor oil; [place] balsam; trypsin This medicine may also interact with the following medications: -benzalkonium chloride -sulfacetamide This list may not describe all possible interactions. Give your health care provider a list of all the medicines, herbs, non-prescription drugs, or dietary  supplements you use. Also tell them if you smoke, drink alcohol, or use illegal drugs. Some items may interact with your medicine. What should I watch for while using this medicine? Tell your doctor or health care professional if your skin condition does not begin to get better. Do not get this medicine in your eyes. If you do, rinse out with plenty of cool tap water. This medicine can make certain skin conditions worse. Only use it for conditions for which your doctor or health care professional has prescribed. What side effects may I notice from receiving this medicine? Side effects that you should report to your doctor or health care professional as soon as possible: -allergic reactions like skin rash, itching or hives, swelling of the face, lips, or tongue Side effects that usually do not require medical attention (report these to your doctor or health care professional if they continue or are bothersome): -discoloration of skin -pain, redness, or irritation at site where applied This list may not describe all possible side effects. Call your doctor for medical advice about side effects. You may report side effects to FDA at 1-800-FDA-1088. Where should I keep my medicine? Keep out of the reach of children. Store at room temperature between 15 and 30 degrees C (59 and 86 degrees F). Throw away any unused medicine after the expiration date. NOTE: This sheet is a summary. It may not cover all possible information. If you have questions about this medicine, talk to your doctor, pharmacist, or health care provider.  2018 Elsevier/Gold Standard (2016-03-15 15:29:46)

## 2017-05-29 NOTE — Telephone Encounter (Signed)
Patient called and left message stating she had a hysterectomy in April by Dr Ilda Basset. Patient states she had sex Sunday night and felt a sharp pain and a lot of bleeding afterwards. Patient states she is still bleeding but much less. 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 Called Dr Ilda Basset who states patient should be worked in for appt today or tomorrow. Added patient to schedule today at 3:30pm. Called patient, no answer- left message stating I am returning your phone call and did receive your message. We have worked you in for an appt today at 3:30pm. Please call us back if you have questions.

## 2017-05-29 NOTE — Progress Notes (Signed)
History:  [name] is a 36 y.o. [id number] who presents to clinic today for vaginal bleeding after total vaginal hysterectomy with bilateral salpingectomy and cystoscopy performed in April. The patient states that bleeding started after intercourse this weekend. She had noted light pink discharge with previous intercourse, but this time noted bright red bleeding. The bleeding is lighter now than initially, but still present. She also states that intercourse was very painful this time, which had not happened before. She denies any vaginal discharge, odor, fever or new sexual partners.   The following portions of the patient's history were reviewed and updated as appropriate: allergies, current medications, family history, past medical history, social history, past surgical history and problem list.  Review of Systems:  Review of Systems  Constitutional: Negative for fever.  Gastrointestinal: Negative for abdominal pain.  Genitourinary:       + vaginal bleeding Neg - vaginal discharge, odor      Objective:  Physical Exam BP 118/76   Pulse 74   Wt 171 lb 9.6 oz (77.8 kg)   LMP 12/18/2016 (Exact Date)   BMI 28.12 kg/m  Physical Exam  Constitutional: She is oriented to person, place, and time. She appears well-developed and well-nourished. No distress.  HENT:  Head: Normocephalic.  Cardiovascular: Normal rate.   Pulmonary/Chest: Effort normal.  Abdominal: Soft. She exhibits no distension. There is no tenderness.  Genitourinary: There is bleeding (scant) in the vagina. No erythema in the vagina. No vaginal discharge found.  Genitourinary Comments: Vaginal cuff intact 0.5 cm area of granulation tissue noted along cuff  Neurological: She is alert and oriented to person, place, and time.  Skin: Skin is warm and dry. No erythema.  Psychiatric: She has a normal mood and affect.  Vitals reviewed.  Procedure:  Discussed patient with Dr. Rip Harbour. States that area resembles granulation  tissue and can be treated with silver nitrate Discussed treatment with patient. Agrees with plan of care Silver nitrate applied to affected area. Patient tolerated well. No active bleeding noted.    Assessment & Plan:  A: S/P Total vaginal hysterectomy, bilateral salpingectomy, cystoscopy Vaginal bleeding Granulation tissue  P:  Pelvic rest advised  Patient to monitor bleeding and call if symptoms worsen Patient to follow-up with Dr. Ilda Basset in 2 weeks for re-evaluation or sooner PRN   Luvenia Redden, PA-C 05/29/2017 3:58 PM

## 2017-06-13 ENCOUNTER — Ambulatory Visit (INDEPENDENT_AMBULATORY_CARE_PROVIDER_SITE_OTHER): Payer: Self-pay | Admitting: Obstetrics and Gynecology

## 2017-06-13 ENCOUNTER — Encounter: Payer: Self-pay | Admitting: Obstetrics and Gynecology

## 2017-06-13 VITALS — BP 122/74 | HR 82 | Ht 65.5 in | Wt 173.5 lb

## 2017-06-13 DIAGNOSIS — N898 Other specified noninflammatory disorders of vagina: Secondary | ICD-10-CM

## 2017-06-13 DIAGNOSIS — Z09 Encounter for follow-up examination after completed treatment for conditions other than malignant neoplasm: Secondary | ICD-10-CM

## 2017-06-13 MED ORDER — METRONIDAZOLE 500 MG PO TABS: 500.0000 mg | ORAL_TABLET | Freq: Two times a day (BID) | ORAL | 2 refills | Status: AC

## 2017-06-13 NOTE — Progress Notes (Signed)
[facility] 06/13/2017  CC: f/u granulation tissue at the vaginal cuff  Subjective:  Pt is s/p 4/24 TVH and had normal exam on 5/23. Pt had sex and noted some VB and pain at that time on 9/4. Pt seen by one of my partners and some granulation tissue noted and silver nitrate applied.  Pt hasn't had any VB but also hasn't had sex since that time either. She does note some d/c but no itching Objective:    BP 122/74   Pulse 82   Ht 5' 5.5" (1.664 m)   Wt 173 lb 8 oz (78.7 kg)   LMP 12/18/2016 (Exact Date)   BMI 28.43 kg/m   General:  alert  Pelvic:  EGBUS normal. Vaginal vault with yellow/white d/c non malodorous. Cuff intact and nttp. At the left apex of the cuff is an approx 1cm tag of granulation tissue. Three sticks of silver nitrate applied.      Assessment:    Doing well postoperatively.    Plan:   Flagyl for bv sent in  Pt told to do one more week of pelvic rest and then let us know if any problems or issues arise  Durene Romans MD Attending Center for [facility])

## 2017-07-16 ENCOUNTER — Encounter: Payer: Self-pay | Admitting: Obstetrics & Gynecology

## 2017-07-16 ENCOUNTER — Ambulatory Visit (INDEPENDENT_AMBULATORY_CARE_PROVIDER_SITE_OTHER): Payer: Self-pay | Admitting: Obstetrics & Gynecology

## 2017-07-16 ENCOUNTER — Encounter (HOSPITAL_COMMUNITY): Payer: Self-pay

## 2017-07-16 VITALS — BP 118/81 | HR 67 | Ht 65.0 in | Wt 171.0 lb

## 2017-07-16 DIAGNOSIS — Z90710 Acquired absence of both cervix and uterus: Secondary | ICD-10-CM

## 2017-07-16 DIAGNOSIS — N898 Other specified noninflammatory disorders of vagina: Secondary | ICD-10-CM

## 2017-07-16 NOTE — Progress Notes (Signed)
Here because bleeding post hysterectomy, only when she has sex, but is very painful. Has had 2 previous visit.

## 2017-07-17 ENCOUNTER — Encounter (HOSPITAL_COMMUNITY): Payer: Self-pay

## 2017-07-17 NOTE — Progress Notes (Signed)
GYNECOLOGY OFFICE VISIT NOTE  History:  36 y.o. [id number] here today for continued increased bleeding with intercourse, she has known granulation tissue at vaginal cuff s/p vaginal hysterectomy on 01/16/17. Has been treated twice with silver nitrate but tissue persists. She is tired of bleeding during intercourse; her husband is afraid to have sex due to the amount of bleeding. She denies any current abnormal vaginal discharge, bleeding, pelvic pain or other concerns.   Past Medical History:  Diagnosis Date  . Anemia   . GERD (gastroesophageal reflux disease)   . Headache 05/22/2014  . History of blood transfusion 04/2014   [facility] - both iron and blood transfusion  . Insomnia   . Polycystic ovarian syndrome   . Vaginal delivery 2000    Past Surgical History:  Procedure Laterality Date  . BILATERAL SALPINGECTOMY Bilateral 01/16/2017   Procedure: BILATERAL SALPINGECTOMY;  Surgeon: Aletha Halim, MD;  Location: [facility];  Service: Gynecology;  Laterality: Bilateral;  . BREAST CYST ASPIRATION Right   . CYSTOSCOPY N/A 01/16/2017   Procedure: CYSTOSCOPY;  Surgeon: Aletha Halim, MD;  Location: [facility];  Service: Gynecology;  Laterality: N/A;  . GASTRIC BYPASS  2004  . VAGINAL HYSTERECTOMY Bilateral 01/16/2017   Procedure: HYSTERECTOMY VAGINAL;  Surgeon: Aletha Halim, MD;  Location: [facility];  Service: Gynecology;  Laterality: Bilateral;  . WISDOM TOOTH EXTRACTION      The following portions of the patient's history were reviewed and updated as appropriate: allergies, current medications, past family history, past medical history, past social history, past surgical history and problem list.    Review of Systems:  Pertinent items noted in HPI and remainder of comprehensive ROS otherwise negative.   Objective:  Physical Exam BP 118/81   Pulse 67   Ht 5\' 5"  (1.651 m)   Wt 171 lb (77.6 kg)   LMP 12/18/2016 (Exact Date)   BMI 28.46 kg/m  CONSTITUTIONAL: Well-developed, well-nourished  female in no acute distress.  HENT:  Normocephalic, atraumatic. External right and left ear normal. Oropharynx is clear and moist EYES: Conjunctivae and EOM are normal. Pupils are equal, round, and reactive to light. No scleral icterus.  NECK: Normal range of motion, supple, no masses SKIN: Skin is warm and dry. No rash noted. Not diaphoretic. No erythema. No pallor. NEUROLOGIC: Alert and oriented to person, place, and time. Normal reflexes, muscle tone coordination. No cranial nerve deficit noted. PSYCHIATRIC: Normal mood and affect. Normal behavior. Normal judgment and thought content. CARDIOVASCULAR: Normal heart rate noted RESPIRATORY: Effort and breath sounds normal, no problems with respiration noted ABDOMEN: Soft, no distention noted.   PELVIC: Normal appearing external genitalia; normal appearing vaginal mucosa.  On evaluation of cuff, there is about a 5 x 5 mm friable granulation tissue noted on left edge of cuff, bleeding encountered when palpated with cotton swabs.  No abnormal discharge noted.  No adnexal tenderness. MUSCULOSKELETAL: Normal range of motion. No edema noted.  Labs and Imaging No results found.  Assessment & Plan:  1. 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 S/P hysterectomy Given persistent granulation tissue s/p two previous silver nitrate treatments, continued bleeding with intercourse, patient desires excision of tissue. This will be done in the OR as a same day procedure with Dr. Ilda Basset.  Discussed with Dr. Ilda Basset, he agreed for this procedure to be added on 07/20/2017. Preoperative instructions given to patient.  Preoperative orders placed.   Routine preventative health maintenance measures emphasized. Please refer to After Visit Summary for other counseling recommendations.  Return in about 3 weeks (around 08/06/2017) for Postop check with Dr. Ilda Basset.   Total face-to-face time with patient: 25 minutes. Over 50% of encounter was spent on counseling and  coordination of care.   Verita Schneiders, MD, [place] Attending [place], [facility] for [employer], [place]

## 2017-07-17 NOTE — Patient Instructions (Signed)
Return to clinic for any scheduled appointments or for any gynecologic concerns as needed.   

## 2017-07-20 ENCOUNTER — Ambulatory Visit (HOSPITAL_COMMUNITY): Payer: Self-pay | Admitting: Anesthesiology

## 2017-07-20 ENCOUNTER — Encounter (HOSPITAL_COMMUNITY): Payer: Self-pay

## 2017-07-20 ENCOUNTER — Other Ambulatory Visit: Payer: Self-pay | Admitting: Obstetrics and Gynecology

## 2017-07-20 ENCOUNTER — Encounter (HOSPITAL_COMMUNITY)
Admission: RE | Disposition: A | Discharge status: Home / Self Care | Payer: Self-pay | Source: Ambulatory Visit | Attending: Obstetrics and Gynecology

## 2017-07-20 ENCOUNTER — Ambulatory Visit (HOSPITAL_COMMUNITY)
Admission: RE | Admit: 2017-07-20 | Discharge: 2017-07-20 | Disposition: A | Discharge status: Home / Self Care | Payer: Self-pay | Source: Ambulatory Visit | Attending: Obstetrics and Gynecology | Admitting: Obstetrics and Gynecology

## 2017-07-20 DIAGNOSIS — N939 Abnormal uterine and vaginal bleeding, unspecified: Secondary | ICD-10-CM

## 2017-07-20 DIAGNOSIS — N7689 Other specified inflammation of vagina and vulva: Secondary | ICD-10-CM | POA: Insufficient documentation

## 2017-07-20 DIAGNOSIS — N761 Subacute and chronic vaginitis: Secondary | ICD-10-CM

## 2017-07-20 DIAGNOSIS — Z9889 Other specified postprocedural states: Secondary | ICD-10-CM

## 2017-07-20 DIAGNOSIS — N760 Acute vaginitis: Secondary | ICD-10-CM

## 2017-07-20 DIAGNOSIS — D649 Anemia, unspecified: Secondary | ICD-10-CM | POA: Insufficient documentation

## 2017-07-20 HISTORY — PX: REPAIR VAGINAL CUFF: SHX6067

## 2017-07-20 LAB — CBC
HCT: 30.3 % — ABNORMAL LOW (ref 36.0–46.0)
Hemoglobin: 9.1 g/dL — ABNORMAL LOW (ref 12.0–15.0)
MCH: 23.4 pg — ABNORMAL LOW (ref 26.0–34.0)
MCHC: 30 g/dL (ref 30.0–36.0)
MCV: 77.9 fL — AB (ref 78.0–100.0)
Platelets: 383 10*3/uL (ref 150–400)
RBC: 3.89 MIL/uL (ref 3.87–5.11)
RDW: 18.2 % — AB (ref 11.5–15.5)
WBC: 4.4 10*3/uL (ref 4.0–10.5)

## 2017-07-20 SURGERY — REPAIR, VAGINAL CUFF
Anesthesia: General | Site: Vagina

## 2017-07-20 MED ORDER — LIDOCAINE HCL (CARDIAC) 20 MG/ML IV SOLN
INTRAVENOUS | Status: DC | PRN
  Administered 2017-07-20: 80 mg via INTRAVENOUS

## 2017-07-20 MED ORDER — PROPOFOL 10 MG/ML IV BOLUS
INTRAVENOUS | Status: AC
  Filled 2017-07-20: qty 20

## 2017-07-20 MED ORDER — FENTANYL CITRATE (PF) 100 MCG/2ML IJ SOLN: 25.0000 ug | INTRAMUSCULAR | Status: DC | PRN

## 2017-07-20 MED ORDER — PROPOFOL 10 MG/ML IV BOLUS
INTRAVENOUS | Status: DC | PRN
  Administered 2017-07-20: 180 mg via INTRAVENOUS

## 2017-07-20 MED ORDER — MIDAZOLAM HCL 2 MG/2ML IJ SOLN
INTRAMUSCULAR | Status: AC
  Filled 2017-07-20: qty 2

## 2017-07-20 MED ORDER — OXYCODONE HCL 5 MG PO TABS: 5.0000 mg | ORAL_TABLET | Freq: Once | ORAL | Status: DC | PRN

## 2017-07-20 MED ORDER — CEFAZOLIN SODIUM-DEXTROSE 2-3 GM-%(50ML) IV SOLR
INTRAVENOUS | Status: AC
  Filled 2017-07-20: qty 50

## 2017-07-20 MED ORDER — METRONIDAZOLE 500 MG PO TABS: 500.0000 mg | ORAL_TABLET | Freq: Two times a day (BID) | ORAL | 1 refills | Status: AC

## 2017-07-20 MED ORDER — CEFAZOLIN SODIUM-DEXTROSE 2-4 GM/100ML-% IV SOLN
2.0000 g | Freq: Once | INTRAVENOUS | Status: AC
  Administered 2017-07-20: 2 g via INTRAVENOUS

## 2017-07-20 MED ORDER — FENTANYL CITRATE (PF) 250 MCG/5ML IJ SOLN
INTRAMUSCULAR | Status: AC
  Filled 2017-07-20: qty 5

## 2017-07-20 MED ORDER — ONDANSETRON HCL 4 MG/2ML IJ SOLN
INTRAMUSCULAR | Status: DC | PRN
Start: 2017-07-20 — End: 2017-07-20
  Administered 2017-07-20: 4 mg via INTRAVENOUS

## 2017-07-20 MED ORDER — KETOROLAC TROMETHAMINE 30 MG/ML IJ SOLN
INTRAMUSCULAR | Status: DC | PRN
  Administered 2017-07-20: 30 mg via INTRAVENOUS

## 2017-07-20 MED ORDER — FLUCONAZOLE 150 MG PO TABS: ORAL_TABLET | ORAL | 0 refills | Status: DC

## 2017-07-20 MED ORDER — ONDANSETRON HCL 4 MG/2ML IJ SOLN
INTRAMUSCULAR | Status: AC
  Filled 2017-07-20: qty 2

## 2017-07-20 MED ORDER — FENTANYL CITRATE (PF) 100 MCG/2ML IJ SOLN
INTRAMUSCULAR | Status: DC | PRN
  Administered 2017-07-20: 100 ug via INTRAVENOUS

## 2017-07-20 MED ORDER — MIDAZOLAM HCL 2 MG/2ML IJ SOLN
INTRAMUSCULAR | Status: DC | PRN
  Administered 2017-07-20: 1 mg via INTRAVENOUS

## 2017-07-20 MED ORDER — LIDOCAINE-EPINEPHRINE 1 %-1:100000 IJ SOLN
INTRAMUSCULAR | Status: AC
  Filled 2017-07-20: qty 1

## 2017-07-20 MED ORDER — LACTATED RINGERS IV SOLN
INTRAVENOUS | Status: DC
  Administered 2017-07-20: 09:00:00 via INTRAVENOUS

## 2017-07-20 MED ORDER — LIDOCAINE HCL (CARDIAC) 20 MG/ML IV SOLN
INTRAVENOUS | Status: AC
  Filled 2017-07-20: qty 5

## 2017-07-20 MED ORDER — LACTATED RINGERS IV SOLN
INTRAVENOUS | Status: DC
  Administered 2017-07-20: 14:00:00 via INTRAVENOUS

## 2017-07-20 MED ORDER — OXYCODONE HCL 5 MG/5ML PO SOLN: 5.0000 mg | Freq: Once | ORAL | Status: DC | PRN

## 2017-07-20 MED ORDER — SCOPOLAMINE 1 MG/3DAYS TD PT72
MEDICATED_PATCH | TRANSDERMAL | Status: AC
  Administered 2017-07-20: 1.5 mg via TRANSDERMAL
  Filled 2017-07-20: qty 1

## 2017-07-20 MED ORDER — DEXAMETHASONE SODIUM PHOSPHATE 10 MG/ML IJ SOLN
INTRAMUSCULAR | Status: DC | PRN
  Administered 2017-07-20: 10 mg via INTRAVENOUS

## 2017-07-20 MED ORDER — SCOPOLAMINE 1 MG/3DAYS TD PT72
1.0000 | MEDICATED_PATCH | Freq: Once | TRANSDERMAL | Status: DC
  Administered 2017-07-20: 1.5 mg via TRANSDERMAL

## 2017-07-20 MED ORDER — ACETAMINOPHEN 160 MG/5ML PO SOLN: 325.0000 mg | ORAL | Status: DC | PRN

## 2017-07-20 MED ORDER — ACETAMINOPHEN 325 MG PO TABS: 325.0000 mg | ORAL_TABLET | ORAL | Status: DC | PRN

## 2017-07-20 SURGICAL SUPPLY — 18 items
BLADE 15 SAFETY STRL DISP (BLADE) ×2 IMPLANT
CATH ROBINSON RED A/P 16FR (CATHETERS) ×2 IMPLANT
CLOTH BEACON ORANGE TIMEOUT ST (SAFETY) ×2 IMPLANT
DECANTER SPIKE VIAL GLASS SM (MISCELLANEOUS) ×2 IMPLANT
GAUZE SPONGE 4X4 16PLY XRAY LF (GAUZE/BANDAGES/DRESSINGS) ×2 IMPLANT
GLOVE BIOGEL PI IND STRL 7.0 (GLOVE) ×1 IMPLANT
GLOVE BIOGEL PI IND STRL 7.5 (GLOVE) ×1 IMPLANT
GLOVE BIOGEL PI IND STRL 8 (GLOVE) ×1 IMPLANT
GLOVE BIOGEL PI INDICATOR 7.0 (GLOVE) ×1
GLOVE BIOGEL PI INDICATOR 7.5 (GLOVE) ×1
GLOVE BIOGEL PI INDICATOR 8 (GLOVE) ×1
GLOVE SURG SS PI 7.0 STRL IVOR (GLOVE) ×2 IMPLANT
GOWN STRL REUS W/TWL LRG LVL3 (GOWN DISPOSABLE) ×4 IMPLANT
NS IRRIG 1000ML POUR BTL (IV SOLUTION) ×2 IMPLANT
PACK VAGINAL WOMENS (CUSTOM PROCEDURE TRAY) ×2 IMPLANT
SUT CHROMIC 4 0 PS 2 18 (SUTURE) ×2 IMPLANT
SUT VICRYL RAPIDE 3-0 36IN (SUTURE) ×2 IMPLANT
TOWEL OR 17X24 6PK STRL BLUE (TOWEL DISPOSABLE) ×4 IMPLANT

## 2017-07-20 NOTE — Anesthesia Preprocedure Evaluation (Signed)
Anesthesia Evaluation  Patient identified by MRN, date of birth, ID band Patient awake    Reviewed: Allergy & Precautions, NPO status , Patient's Chart, lab work & pertinent test results  History of Anesthesia Complications Negative for: history of anesthetic complications  Airway Mallampati: I   Neck ROM: Full    Dental  (+) Teeth Intact   Pulmonary neg pulmonary ROS,    breath sounds clear to auscultation       Cardiovascular negative cardio ROS   Rhythm:Regular     Neuro/Psych negative neurological ROS  negative psych ROS   GI/Hepatic Neg liver ROS, GERD  Controlled,  Endo/Other  negative endocrine ROS  Renal/GU negative Renal ROS     Musculoskeletal   Abdominal   Peds  Hematology  (+) anemia ,   Anesthesia Other Findings   Reproductive/Obstetrics                             Anesthesia Physical Anesthesia Plan  ASA: I  Anesthesia Plan: General   Post-op Pain Management:    Induction: Intravenous  PONV Risk Score and Plan: 3 and Ondansetron, Dexamethasone and Midazolam  Airway Management Planned: LMA  Additional Equipment: None  Intra-op Plan:   Post-operative Plan: Extubation in OR  Informed Consent: I have reviewed the patients History and Physical, chart, labs and discussed the procedure including the risks, benefits and alternatives for the proposed anesthesia with the patient or authorized representative who has indicated his/her understanding and acceptance.   Dental advisory given  Plan Discussed with: CRNA and Surgeon  Anesthesia Plan Comments:         Anesthesia Quick Evaluation

## 2017-07-20 NOTE — Anesthesia Procedure Notes (Signed)
Procedure Name: LMA Insertion Date/Time: 07/20/2017 2:14 PM Performed by: Casimer Lanius A Pre-anesthesia Checklist: Patient being monitored, Patient identified, Emergency Drugs available and Suction available Patient Re-evaluated:Patient Re-evaluated prior to induction Oxygen Delivery Method: Circle system utilized Preoxygenation: Pre-oxygenation with 100% oxygen Induction Type: IV induction and Inhalational induction Ventilation: Mask ventilation without difficulty LMA: LMA inserted LMA Size: 4.0 Number of attempts: 1 Dental Injury: Teeth and Oropharynx as per pre-operative assessment

## 2017-07-20 NOTE — Op Note (Signed)
Operative Note   07/20/2017  PRE-OP DIAGNOSIS: Granulation tissue at the vaginal cuff. Persistent vaginal bleeding   POST-OP DIAGNOSIS: Same.    SURGEON: Surgeon(s) and Role:    * Joory Gough, Eduard Clos, MD - Primary  ASSISTANT: None  PROCEDURE: Procedure(s): Excision of Granulation Tissue from Vaginal cuff   ANESTHESIA: General   ESTIMATED BLOOD LOSS: 36mL  DRAINS: 189mL UOP by I/O cath   TOTAL IV FLUIDS: per anesthesia note  SPECIMENS: granulation tissue from the cuff  VTE PROPHYLAXIS: SCDs  ANTIBIOTICS: Ancef 2gm IV x 1  COMPLICATIONS: none  DISPOSITION: PACU - hemodynamically stable.  CONDITION: stable  FINDINGS: Exam under anesthesia revealed no vaginal bleeding and intact vaginal cuff. suture line fully healed and not apparent and vaginal vault was smooth except for a 61mm granulation tissue/polypoid appearing tissue. This was at the right aspect of the vaginal vault about 3cm from the right lateral aspect. It was easily friable when touched and it was benign appearing.   PROCEDURE IN DETAIL:  After informed consent was obtained, the patient was taken to the operating room where anesthesia was obtained without difficulty. The patient was positioned in the dorsal lithotomy position in [place].Next a large graves speculum was placed. The area of interest was very deep and distal, so single toothed tenaculum was used to bring the area closer into view. The above described tissue was then removed with polyp forceps and the base cauterized with sticks of silver nitrate. Using 3-0 vicryl rapide, this area was oversewn with normal vaginal mucosa at the edges.   The patient tolerated the procedure well. The patient was taken to recovery room in excellent condition.  Durene Romans MD Attending Center for [employer])

## 2017-07-20 NOTE — Transfer of Care (Signed)
Immediate Anesthesia Transfer of Care Note  Patient: Haley Martinez  Procedure(s) Performed: Excision of Granulation Tissue from Vaginal cuff (N/A Vagina )  Patient Location: PACU  Anesthesia Type:General  Level of Consciousness: sedated  Airway & Oxygen Therapy: Patient Spontanous Breathing and Patient connected to nasal cannula oxygen  Post-op Assessment: Report given to RN  Post vital signs: Reviewed and stable  Last Vitals:  Vitals:   07/20/17 0812  BP: 117/74  Pulse: 67  Resp: 16  Temp: 36.6 C  SpO2: 100%    Last Pain:  Vitals:   07/20/17 0812  TempSrc: Oral  PainSc: 0-No pain      Patients Stated Pain Goal: 4 (19/37/90 2409)  Complications: No apparent anesthesia complications

## 2017-07-20 NOTE — H&P (Signed)
Obstetrics & Gynecology H&P   Date of Surgery: 07/20/2017    [facility] Primary Care Provider: Valinda Party  Reason for Admission: scheduled surgery  History of Present Illness: Ms. Nusz is a 36 y.o. [id number] (Patient's last menstrual period was 12/18/2016 (exact date).), with the above CC. PMHx is significant for [facility] in April 2018. Patient has had persistent granulation tissue despite two applications of silver nitrate and persistent VB .    No current issues or problems.   ROS: A 12-point review of systems was performed and negative, except as stated in the above HPI.  OBGYN History: As per HPI. OB History  Gravida Para Term Preterm AB Living  3 1 1  0 2 1  SAB TAB Ectopic Multiple Live Births  2 0 0 0 1    # Outcome Date GA Lbr Len/2nd Weight Sex Delivery Anes PTL Lv  3 Term 07/04/99    F Vag-Spont     2 SAB           1 SAB                Past Medical History: Past Medical History:  Diagnosis Date  . Anemia   . GERD (gastroesophageal reflux disease)   . Headache 05/22/2014   Migraines  . History of blood transfusion 04/2014   [facility] - both iron and blood transfusion  . Insomnia   . Polycystic ovarian syndrome   . Vaginal delivery 2000    Past Surgical History: Past Surgical History:  Procedure Laterality Date  . BILATERAL SALPINGECTOMY Bilateral 01/16/2017   Procedure: BILATERAL SALPINGECTOMY;  Surgeon: Aletha Halim, MD;  Location: [facility];  Service: Gynecology;  Laterality: Bilateral;  . BREAST CYST ASPIRATION Right   . CYSTOSCOPY N/A 01/16/2017   Procedure: CYSTOSCOPY;  Surgeon: Aletha Halim, MD;  Location: [facility];  Service: Gynecology;  Laterality: N/A;  . GASTRIC BYPASS  2004  . VAGINAL HYSTERECTOMY Bilateral 01/16/2017   Procedure: HYSTERECTOMY VAGINAL;  Surgeon: Aletha Halim, MD;  Location: [facility];  Service: Gynecology;  Laterality: Bilateral;  . WISDOM TOOTH EXTRACTION      Family History:  Family  History  Problem Relation Age of Onset  . Heart disease Mother   . Hyperlipidemia Mother   . Hypertension Mother   . Hyperlipidemia Father   . Hypertension Father   . Cancer Maternal Uncle        pancreatic ca  . Drug abuse Maternal Grandmother   . Drug abuse Paternal Grandmother   . Stroke Paternal Grandfather     Social History:  Social History   Social History  . Marital status: Single    Spouse name: N/A  . Number of children: N/A  . Years of education: N/A   Occupational History  . Not on file.   Social History Main Topics  . Smoking status: Never Smoker  . Smokeless tobacco: Never Used  . Alcohol use 8.4 oz/week    14 Glasses of wine per week     Comment: occ  . Drug use: No  . Sexual activity: Yes    Birth control/ protection: None     Comment: hysterectomy   Other Topics Concern  . Not on file   Social History Narrative  . No narrative on file    Allergy: Allergies  Allergen Reactions  . Trazodone And Nefazodone     "severe headache"    Current Outpatient Medications: Prescriptions Prior to Admission  Medication Sig Dispense  Refill Last Dose  . ferrous gluconate (FERGON) 324 MG tablet Take 1 tablet (324 mg total) by mouth 2 (two) times daily with a meal. 60 tablet 0 Past Week at Unknown time     Hospital Medications: Current Facility-Administered Medications  Medication Dose Route Frequency Provider Last Rate Last Dose  . ceFAZolin (ANCEF) 2-3 GM-% IVPB SOLR           . ceFAZolin (ANCEF) IVPB 2g/100 mL premix  2 g Intravenous Once Aletha Halim, MD      . lactated ringers infusion   Intravenous Continuous Nolon Nations, MD 125 mL/hr at 07/20/17 0846    . lactated ringers infusion   Intravenous Continuous Anyanwu, Ugonna A, MD      . scopolamine (TRANSDERM-SCOP) 1 MG/3DAYS 1.5 mg  1 patch Transdermal Once Nolon Nations, MD   1.5 mg at 07/20/17 0827     Physical Exam:  Current Vital Signs 24h Vital Sign Ranges  T 97.9 F (36.6 C)  Temp  Avg: 97.9 F (36.6 C)  Min: 97.9 F (36.6 C)  Max: 97.9 F (36.6 C)  BP 117/74 BP  Min: 117/74  Max: 117/74  HR 67 Pulse  Avg: 67  Min: 67  Max: 67  RR 16 Resp  Avg: 16  Min: 16  Max: 16  SaO2 100 % Not Delivered SpO2  Avg: 100 %  Min: 100 %  Max: 100 %       24 Hour I/O Current Shift I/O  Time Ins Outs No intake/output data recorded. No intake/output data recorded.   Patient Vitals for the past 24 hrs:  BP Temp Temp src Pulse Resp SpO2  07/20/17 0812 117/74 97.9 F (36.6 C) Oral 67 16 100 %    There is no height or weight on file to calculate BMI. General appearance: Well nourished, well developed female in no acute distress.  Neck:  Supple, normal appearance, and no thyromegaly  Cardiovascular: S1, S2 normal, no murmur, rub or gallop, regular rate and rhythm Respiratory:  Clear to auscultation bilateral. Normal respiratory effort Abdomen: positive bowel sounds and no masses, hernias; diffusely non tender to palpation, non distended Neuro/Psych:  Normal mood and affect.  Skin:  Warm and dry.  Extremities: no clubbing, cyanosis, or edema.   From Dr Harolyn Rutherford on 10/22 PELVIC: Normal appearing external genitalia; normal appearing vaginal mucosa.  On evaluation of cuff, there is about a 5 x 5 mm friable granulation tissue noted on left edge of cuff, bleeding encountered when palpated with cotton swabs.  No abnormal discharge noted.  No adnexal tenderness.   Laboratory:  Recent Labs Lab 07/20/17 0800  WBC 4.4  HGB 9.1*  HCT 30.3*  PLT 383    Imaging:  No new imaging  Assessment: Ms. Shawhan is a 36 y.o. [id number] (Patient's last menstrual period was 12/18/2016 (exact date).) with persistent granulation tissue; pt current stable  Plan: D/w pt and amenable to surgery for granulation tissue excision. Will do ancef   Durene Romans MD Attending Center for [facility])

## 2017-07-20 NOTE — Discharge Instructions (Addendum)
Please do NOT take Motrin/Advil/Ibuprofen until after 8:30pm    General Anesthesia, Adult, Care After These instructions provide you with information about caring for yourself after your procedure. Your health care provider may also give you more specific instructions. Your treatment has been planned according to current medical practices, but problems sometimes occur. Call your health care provider if you have any problems or questions after your procedure. What can I expect after the procedure? After the procedure, it is common to have:  Vomiting.  A sore throat.  Mental slowness.  It is common to feel:  Nauseous.  Cold or shivery.  Sleepy.  Tired.  Sore or achy, even in parts of your body where you did not have surgery.  Follow these instructions at home: For at least 24 hours after the procedure:  Do not: ? Participate in activities where you could fall or become injured. ? Drive. ? Use heavy machinery. ? Drink alcohol. ? Take sleeping pills or medicines that cause drowsiness. ? Make important decisions or sign legal documents. ? Take care of children on your own.  Rest. Eating and drinking  If you vomit, drink water, juice, or soup when you can drink without vomiting.  Drink enough fluid to keep your urine clear or pale yellow.  Make sure you have little or no nausea before eating solid foods.  Follow the diet recommended by your health care provider. General instructions  Have a responsible adult stay with you until you are awake and alert.  Return to your normal activities as told by your health care provider. Ask your health care provider what activities are safe for you.  Take over-the-counter and prescription medicines only as told by your health care provider.  If you smoke, do not smoke without supervision.  Keep all follow-up visits as told by your health care provider. This is important. Contact a health care provider if:  You continue to  have nausea or vomiting at home, and medicines are not helpful.  You cannot drink fluids or start eating again.  You cannot urinate after 8-12 hours.  You develop a skin rash.  You have fever.  You have increasing redness at the site of your procedure. Get help right away if:  You have difficulty breathing.  You have chest pain.  You have unexpected bleeding.  You feel that you are having a life-threatening or urgent problem. This information is not intended to replace advice given to you by your health care provider. Make sure you discuss any questions you have with your health care provider. Document Released: 12/18/2000 Document Revised: 02/14/2016 Document Reviewed: 08/26/2015 Elsevier Interactive Patient Education  [employer].   We will discuss your surgery once again in detail at your post-op visit in two to four weeks. If you havent already done so, please call to make your appointment as soon as possible.  These instructions give you information on caring for yourself after your procedure. Your doctor may also give you more specific instructions. Call your doctor if you have any problems or questions after your procedure. HOME CARE  Do not drive for 24 hours.  Wait 1 week before doing any activities that wear you out.  Do not stand for a long time.  Limit stair climbing to once or twice a day.  Rest often.  Continue with your usual diet.  Drink enough fluids to keep your pee (urine) clear or pale yellow.  If you have a hard time pooping (constipation), you may:  Take a medicine to help you go poop (laxative) as told by your doctor.  Eat more fruit and bran.  Drink more fluids.  Take showers, not baths, for as long as told by your doctor.  Do not swim or use a hot tub until your doctor says it is okay.  Have someone with you for 1day after the procedure.  Do not douche, use tampons, or have sex (intercourse) until seen by your doctor  Only  take medicines as told by your doctor. Do not take aspirin. It can cause bleeding.  Keep all doctor visits. GET HELP IF:  You have cramps or pain not helped by medicine.  You have new pain in the belly (abdomen).  You have a bad smelling fluid coming from your vagina.  You have a rash.  You have problems with any medicine. GET HELP RIGHT AWAY IF:   You start to bleed more than a regular period.  You have a fever.  You have chest pain.  You have trouble breathing.  You feel dizzy or feel like passing out (fainting).  You pass out.  You have pain in the tops of your shoulders.  You have vaginal bleeding with or without clumps of blood (blood clots). MAKE SURE YOU:  Understand these instructions.  Will watch your condition.  Will get help right away if you are not doing well or get worse. Document Released: 06/20/2008 Document Revised: 09/16/2013 Document Reviewed: 04/10/2013 [facility] Patient Information 2015 [place], [place]. This information is not intended to replace advice given to you by your health care provider. Make sure you discuss any questions you have with your health care provider.

## 2017-07-21 NOTE — Anesthesia Postprocedure Evaluation (Signed)
Anesthesia Post Note  Patient: Haley Martinez  Procedure(s) Performed: Excision of Granulation Tissue from Vaginal cuff (N/A Vagina )     Patient location during evaluation: PACU Anesthesia Type: General Level of consciousness: sedated and patient cooperative Pain management: pain level controlled Vital Signs Assessment: post-procedure vital signs reviewed and stable Respiratory status: spontaneous breathing Cardiovascular status: stable Anesthetic complications: no    Last Vitals:  Vitals:   07/20/17 1515 07/20/17 1525  BP: 112/71 113/84  Pulse: 64 (!) 59  Resp: 17 16  Temp:    SpO2: 100% 98%    Last Pain:  Vitals:   07/20/17 0812  TempSrc: Oral  PainSc: 0-No pain   Pain Goal: Patients Stated Pain Goal: 4 (07/20/17 0347)               Nolon Nations

## 2017-07-22 ENCOUNTER — Encounter (HOSPITAL_COMMUNITY): Payer: Self-pay | Admitting: Obstetrics and Gynecology

## 2017-07-31 ENCOUNTER — Telehealth: Payer: Self-pay | Admitting: General Practice

## 2017-07-31 NOTE — Telephone Encounter (Signed)
Called and left VM in regards to Post op appointment with Dr. Ilda Basset on 08/09/17 at 1:40pm.  Asked patient to give our office a call if unable to keep appointment or questions and concerns.

## 2017-08-09 ENCOUNTER — Ambulatory Visit (INDEPENDENT_AMBULATORY_CARE_PROVIDER_SITE_OTHER): Payer: Self-pay | Admitting: Obstetrics and Gynecology

## 2017-08-09 ENCOUNTER — Encounter: Payer: Self-pay | Admitting: Obstetrics and Gynecology

## 2017-08-09 VITALS — BP 112/59 | HR 89 | Ht 65.0 in | Wt 173.0 lb

## 2017-08-09 DIAGNOSIS — Z09 Encounter for follow-up examination after completed treatment for conditions other than malignant neoplasm: Secondary | ICD-10-CM

## 2017-08-09 DIAGNOSIS — Z9889 Other specified postprocedural states: Secondary | ICD-10-CM

## 2017-08-09 NOTE — Progress Notes (Signed)
Center for [facility] 08/09/2017  CC: regular post op visit   Subjective:    S/p 10/26 EUA and removal of granulation tissue from vaginal cuff after 4/24 TVH and failure of outpatient management. Granulation tissue path was negative and pt is asymptomatic and has been on pelvic rest since the surgery.  Objective:    BP (!) 112/59   Pulse 89   Ht 5\' 5"  (1.651 m)   Wt 173 lb (78.5 kg)   LMP 12/18/2016 (Exact Date)   BMI 28.79 kg/m   NAD EGBUS normal. Vagina: normal, no blood and normal d/c in vault. Cuff: nttp, well healed, no e/o granulation tissue. Old suture loose in vault and removed.  Assessment:    Doing well postoperatively.    Plan:  Released from pelvic rest  RTC: PRN  Durene Romans MD Attending Center for [facility])

## 2017-09-12 NOTE — Progress Notes (Deleted)
   CC: ***  HPI:  Ms.Marca C Meth is a 36 y.o.   Past Medical History:  Diagnosis Date  . Anemia   . GERD (gastroesophageal reflux disease)   . Headache 05/22/2014   Migraines  . History of blood transfusion 04/2014   [facility] - both iron and blood transfusion  . Insomnia   . Polycystic ovarian syndrome   . Vaginal delivery 2000    Review of Systems:  ***  Physical Exam:  There were no vitals filed for this visit. ***  Assessment & Plan:   See encounters tab for problem based medical decision making.  Assessment: Iron deficiency anemia Patient currently takes ***.  Follows with hematology?  Assessment:   patient had a TVH in April 2018 had persistent granulation tissue despite silver nitrate and persistent VP. She recently had surgery on 06/2017 for granulation tissue excision.  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 Will check vitamin D level today. She is supposed to be on ***  Assessment:  History of vitamin B-12 deficiency With history of gastric bypass surgery will check vitamin B-12.  Last checked in 10/2016 and was normal.   Patient {GC/GE:[id number]::"discussed with","seen with"} Dr. {NAMES:[id number]::"Butcher","Granfortuna","E. Sanvika Cuttino","Klima","Mullen","Narendra","Vincent"}

## 2017-09-13 ENCOUNTER — Encounter: Payer: Self-pay | Admitting: Internal Medicine

## 2017-09-14 ENCOUNTER — Encounter: Payer: Self-pay | Admitting: Internal Medicine

## 2018-03-26 ENCOUNTER — Encounter (HOSPITAL_COMMUNITY): Payer: Self-pay | Admitting: Emergency Medicine

## 2018-03-26 ENCOUNTER — Emergency Department (HOSPITAL_COMMUNITY)
Admission: EM | Admit: 2018-03-26 | Discharge: 2018-03-27 | Disposition: A | Discharge status: Home / Self Care | Payer: BC Managed Care – PPO | Attending: Emergency Medicine | Admitting: Emergency Medicine

## 2018-03-26 ENCOUNTER — Other Ambulatory Visit: Payer: Self-pay

## 2018-03-26 DIAGNOSIS — G43009 Migraine without aura, not intractable, without status migrainosus: Secondary | ICD-10-CM | POA: Insufficient documentation

## 2018-03-26 DIAGNOSIS — Z79899 Other long term (current) drug therapy: Secondary | ICD-10-CM | POA: Insufficient documentation

## 2018-03-26 DIAGNOSIS — R51 Headache: Secondary | ICD-10-CM | POA: Diagnosis present

## 2018-03-26 MED ORDER — DIPHENHYDRAMINE HCL 50 MG/ML IJ SOLN
25.0000 mg | Freq: Once | INTRAMUSCULAR | Status: AC
  Administered 2018-03-26: 25 mg via INTRAVENOUS
  Filled 2018-03-26: qty 1

## 2018-03-26 MED ORDER — SODIUM CHLORIDE 0.9 % IV BOLUS
1000.0000 mL | Freq: Once | INTRAVENOUS | Status: AC
  Administered 2018-03-26: 1000 mL via INTRAVENOUS

## 2018-03-26 MED ORDER — KETOROLAC TROMETHAMINE 30 MG/ML IJ SOLN
30.0000 mg | Freq: Once | INTRAMUSCULAR | Status: AC
  Administered 2018-03-26: 30 mg via INTRAVENOUS
  Filled 2018-03-26: qty 1

## 2018-03-26 MED ORDER — PROCHLORPERAZINE EDISYLATE 10 MG/2ML IJ SOLN
10.0000 mg | Freq: Once | INTRAMUSCULAR | Status: AC
  Administered 2018-03-26: 10 mg via INTRAVENOUS
  Filled 2018-03-26: qty 2

## 2018-03-26 NOTE — ED Triage Notes (Signed)
Pt arriving POV with complaint of migaine x2 days that has moved from the back of her head up to the front. Pt took OTC medications at home with no relief.

## 2018-03-26 NOTE — ED Provider Notes (Signed)
[facility] DEPT Provider Note   CSN: [id number] Arrival date & time: 03/26/18  1958     History   Chief Complaint Chief Complaint  Patient presents with  . Migraine    HPI Haley Martinez is a 37 y.o. female.  HPI Patient presents to the emergency department with a migraine headache that started Sunday but got worse this morning.  The patient states that she has a history of migraines but she can normally manage them at home.  Patient states she took Goody's powder without relief of her symptoms.  The patient denies chest pain, shortness of breath, blurred vision, neck pain, fever, cough, weakness, numbness, dizziness, anorexia, edema, abdominal pain, nausea, vomiting, diarrhea, rash, back pain, dysuria, hematemesis, bloody stool, near syncope, or syncope. Past Medical History:  Diagnosis Date  . Anemia   . GERD (gastroesophageal reflux disease)   . Headache 05/22/2014   Migraines  . History of blood transfusion 04/2014   [facility] - both iron and blood transfusion  . Insomnia   . Polycystic ovarian syndrome   . Vaginal delivery 2000    Patient Active Problem List   Diagnosis Date Noted  . Dental caries 03/19/2017  . History of prediabetes 10/30/2016  . Breast mass, right 10/30/2016  . Abnormal weight gain 04/14/2016  . PCOS (polycystic ovarian syndrome) 04/14/2016  . Thrombocytosis ([place]) 05/24/2014  . Headache 05/22/2014  . Iron deficiency anemia 05/15/2014  . Other chest pain 05/15/2014  . Vitamin D deficiency 05/14/2014  . Gastric bypass status for obesity 04/17/2014  . At risk for impaired health maintenance 04/17/2014  . B12 deficiency anemia 09/09/2007  . Insomnia 08/02/2007    Past Surgical History:  Procedure Laterality Date  . BILATERAL SALPINGECTOMY Bilateral 01/16/2017   Procedure: BILATERAL SALPINGECTOMY;  Surgeon: Aletha Halim, MD;  Location: [facility];  Service: Gynecology;  Laterality: Bilateral;  . BREAST CYST ASPIRATION  Right   . CYSTOSCOPY N/A 01/16/2017   Procedure: CYSTOSCOPY;  Surgeon: Aletha Halim, MD;  Location: [facility];  Service: Gynecology;  Laterality: N/A;  . GASTRIC BYPASS  2004  . REPAIR VAGINAL CUFF N/A 07/20/2017   Procedure: Excision of Granulation Tissue from Vaginal cuff;  Surgeon: Aletha Halim, MD;  Location: [facility];  Service: Gynecology;  Laterality: N/A;  . VAGINAL HYSTERECTOMY Bilateral 01/16/2017   Procedure: HYSTERECTOMY VAGINAL;  Surgeon: Aletha Halim, MD;  Location: [facility];  Service: Gynecology;  Laterality: Bilateral;  . WISDOM TOOTH EXTRACTION       OB History    Gravida  3   Para  1   Term  1   Preterm  0   AB  2   Living  1     SAB  2   TAB  0   Ectopic  0   Multiple  0   Live Births  1            Home Medications    Prior to Admission medications   Medication Sig Start Date End Date Taking? Authorizing Provider  ferrous gluconate (FERGON) 324 MG tablet Take 1 tablet (324 mg total) by mouth 2 (two) times daily with a meal. 01/18/17  Yes Aletha Halim, MD  Multiple Vitamins-Minerals (MULTIVITAMIN WITH MINERALS) tablet Take 1 tablet by mouth daily.   Yes [provider]    Family History Family History  Problem Relation Age of Onset  . Heart disease Mother   . Hyperlipidemia Mother   . Hypertension Mother   . Hyperlipidemia  Father   . Hypertension Father   . Cancer Maternal Uncle        pancreatic ca  . Drug abuse Maternal Grandmother   . Drug abuse Paternal Grandmother   . Stroke Paternal Grandfather     Social History Social History   Tobacco Use  . Smoking status: Never Smoker  . Smokeless tobacco: Never Used  Substance Use Topics  . Alcohol use: Yes    Alcohol/week: 8.4 oz    Types: 14 Glasses of wine per week    Comment: occ  . Drug use: No     Allergies   Trazodone hcl and Trazodone and nefazodone   Review of Systems Review of Systems All other systems negative except as documented in the HPI. All  pertinent positives and negatives as reviewed in the HPI.  Physical Exam Updated Vital Signs BP 122/80 (BP Location: Right Arm)   Pulse 70   Temp 98.6 F (37 C) (Oral)   Resp 18   Ht 5' 5.5" (1.664 m)   Wt 74.8 kg (165 lb)   LMP 12/18/2016 (Exact Date)   SpO2 99%   BMI 27.04 kg/m   Physical Exam  Constitutional: She is oriented to person, place, and time. She appears well-developed and well-nourished. No distress.  HENT:  Head: Normocephalic and atraumatic.  Mouth/Throat: Oropharynx is clear and moist.  Eyes: Pupils are equal, round, and reactive to light.  Neck: Normal range of motion. Neck supple.  Cardiovascular: Normal rate, regular rhythm and normal heart sounds. Exam reveals no gallop and no friction rub.  No murmur heard. Pulmonary/Chest: Effort normal and breath sounds normal. No respiratory distress. She has no wheezes.  Abdominal: Soft. Bowel sounds are normal. She exhibits no distension. There is no tenderness.  Neurological: She is alert and oriented to person, place, and time. No sensory deficit. She exhibits normal muscle tone. Coordination normal.  Skin: Skin is warm and dry. Capillary refill takes less than 2 seconds. No rash noted. No erythema.  Psychiatric: She has a normal mood and affect. Her behavior is normal.  Nursing note and vitals reviewed.    ED Treatments / Results  Labs (all labs ordered are listed, but only abnormal results are displayed) Labs Reviewed - No data to display  EKG None  Radiology No results found.  Procedures Procedures (including critical care time)  Medications Ordered in ED Medications  sodium chloride 0.9 % bolus 1,000 mL (1,000 mLs Intravenous New Bag/Given 03/26/18 2241)  prochlorperazine (COMPAZINE) injection 10 mg (10 mg Intravenous Given 03/26/18 2246)  diphenhydrAMINE (BENADRYL) injection 25 mg (25 mg Intravenous Given 03/26/18 2243)  ketorolac (TORADOL) 30 MG/ML injection 30 mg (30 mg Intravenous Given 03/26/18 2249)       Initial Impression / Assessment and Plan / ED Course  I have reviewed the triage vital signs and the nursing notes.  Pertinent labs & imaging results that were available during my care of the patient were reviewed by me and considered in my medical decision making (see chart for details).     Patient is treated for migraine headache with IV fluids Compazine Benadryl and Toradol.  She is feeling resolution of her symptoms at this time.  We will have the patient follow-up with her primary doctor.  Told to return here as needed.  Patient agrees with plan and all questions were answered.  Final Clinical Impressions(s) / ED Diagnoses   Final diagnoses:  None    ED Discharge Orders    None  Dalia Heading, PA-C 03/26/18 2358    Veryl Speak, MD 03/27/18 [phone]

## 2018-03-27 NOTE — Discharge Instructions (Addendum)
Return here as needed. Follow up with your primary doctor. Increase your fluid intake. °

## 2019-12-11 ENCOUNTER — Ambulatory Visit: Payer: Self-pay | Attending: Internal Medicine

## 2021-05-27 ENCOUNTER — Other Ambulatory Visit: Payer: Self-pay

## 2021-05-27 ENCOUNTER — Emergency Department (HOSPITAL_COMMUNITY): Payer: Self-pay

## 2021-05-27 ENCOUNTER — Observation Stay (HOSPITAL_COMMUNITY): Payer: Self-pay

## 2021-05-27 ENCOUNTER — Encounter (HOSPITAL_COMMUNITY): Payer: Self-pay

## 2021-05-27 ENCOUNTER — Inpatient Hospital Stay (HOSPITAL_COMMUNITY)
Admission: EM | Admit: 2021-05-27 | Discharge: 2021-05-29 | DRG: 389 | Disposition: A | Discharge status: Home / Self Care | Payer: Self-pay | Attending: Family Medicine | Admitting: Family Medicine

## 2021-05-27 DIAGNOSIS — D75838 Other thrombocytosis: Secondary | ICD-10-CM | POA: Diagnosis present

## 2021-05-27 DIAGNOSIS — Z83438 Family history of other disorder of lipoprotein metabolism and other lipidemia: Secondary | ICD-10-CM

## 2021-05-27 DIAGNOSIS — Z20822 Contact with and (suspected) exposure to covid-19: Secondary | ICD-10-CM | POA: Diagnosis present

## 2021-05-27 DIAGNOSIS — E538 Deficiency of other specified B group vitamins: Secondary | ICD-10-CM | POA: Diagnosis present

## 2021-05-27 DIAGNOSIS — Z90711 Acquired absence of uterus with remaining cervical stump: Secondary | ICD-10-CM

## 2021-05-27 DIAGNOSIS — Z823 Family history of stroke: Secondary | ICD-10-CM

## 2021-05-27 DIAGNOSIS — Z800 Family history of malignant neoplasm of digestive organs: Secondary | ICD-10-CM

## 2021-05-27 DIAGNOSIS — K5669 Other partial intestinal obstruction: Principal | ICD-10-CM | POA: Diagnosis present

## 2021-05-27 DIAGNOSIS — T402X5A Adverse effect of other opioids, initial encounter: Secondary | ICD-10-CM | POA: Diagnosis present

## 2021-05-27 DIAGNOSIS — Z885 Allergy status to narcotic agent status: Secondary | ICD-10-CM

## 2021-05-27 DIAGNOSIS — Z9884 Bariatric surgery status: Secondary | ICD-10-CM

## 2021-05-27 DIAGNOSIS — Z886 Allergy status to analgesic agent status: Secondary | ICD-10-CM

## 2021-05-27 DIAGNOSIS — K56609 Unspecified intestinal obstruction, unspecified as to partial versus complete obstruction: Secondary | ICD-10-CM | POA: Diagnosis present

## 2021-05-27 DIAGNOSIS — D75839 Thrombocytosis, unspecified: Secondary | ICD-10-CM | POA: Diagnosis present

## 2021-05-27 DIAGNOSIS — K56600 Partial intestinal obstruction, unspecified as to cause: Secondary | ICD-10-CM

## 2021-05-27 DIAGNOSIS — Z888 Allergy status to other drugs, medicaments and biological substances status: Secondary | ICD-10-CM

## 2021-05-27 DIAGNOSIS — Z8249 Family history of ischemic heart disease and other diseases of the circulatory system: Secondary | ICD-10-CM

## 2021-05-27 DIAGNOSIS — Z0189 Encounter for other specified special examinations: Secondary | ICD-10-CM

## 2021-05-27 DIAGNOSIS — G43909 Migraine, unspecified, not intractable, without status migrainosus: Secondary | ICD-10-CM | POA: Diagnosis present

## 2021-05-27 DIAGNOSIS — D509 Iron deficiency anemia, unspecified: Secondary | ICD-10-CM | POA: Diagnosis present

## 2021-05-27 DIAGNOSIS — K219 Gastro-esophageal reflux disease without esophagitis: Secondary | ICD-10-CM | POA: Diagnosis present

## 2021-05-27 DIAGNOSIS — R188 Other ascites: Secondary | ICD-10-CM | POA: Diagnosis present

## 2021-05-27 DIAGNOSIS — L299 Pruritus, unspecified: Secondary | ICD-10-CM | POA: Diagnosis present

## 2021-05-27 LAB — CBC WITH DIFFERENTIAL/PLATELET
Abs Immature Granulocytes: 0.03 10*3/uL (ref 0.00–0.07)
Basophils Absolute: 0 10*3/uL (ref 0.0–0.1)
Basophils Relative: 0 %
Eosinophils Absolute: 0 10*3/uL (ref 0.0–0.5)
Eosinophils Relative: 0 %
HCT: 34.3 % — ABNORMAL LOW (ref 36.0–46.0)
Hemoglobin: 10.4 g/dL — ABNORMAL LOW (ref 12.0–15.0)
Immature Granulocytes: 0 %
Lymphocytes Relative: 10 %
Lymphs Abs: 1 10*3/uL (ref 0.7–4.0)
MCH: 24 pg — ABNORMAL LOW (ref 26.0–34.0)
MCHC: 30.3 g/dL (ref 30.0–36.0)
MCV: 79.2 fL — ABNORMAL LOW (ref 80.0–100.0)
Monocytes Absolute: 0.2 10*3/uL (ref 0.1–1.0)
Monocytes Relative: 3 %
Neutro Abs: 8.3 10*3/uL — ABNORMAL HIGH (ref 1.7–7.7)
Neutrophils Relative %: 87 %
Platelets: 441 10*3/uL — ABNORMAL HIGH (ref 150–400)
RBC: 4.33 MIL/uL (ref 3.87–5.11)
RDW: 21 % — ABNORMAL HIGH (ref 11.5–15.5)
WBC: 9.6 10*3/uL (ref 4.0–10.5)
nRBC: 0 % (ref 0.0–0.2)

## 2021-05-27 LAB — RESP PANEL BY RT-PCR (FLU A&B, COVID) ARPGX2
Influenza A by PCR: NEGATIVE
Influenza B by PCR: NEGATIVE
SARS Coronavirus 2 by RT PCR: NEGATIVE

## 2021-05-27 LAB — MAGNESIUM: Magnesium: 2.1 mg/dL (ref 1.7–2.4)

## 2021-05-27 LAB — HEPATIC FUNCTION PANEL
ALT: 21 U/L (ref 0–44)
AST: 36 U/L (ref 15–41)
Albumin: 3.9 g/dL (ref 3.5–5.0)
Alkaline Phosphatase: 64 U/L (ref 38–126)
Bilirubin, Direct: <0.1 mg/dL (ref 0.0–0.2)
Total Bilirubin: 0.6 mg/dL (ref 0.3–1.2)
Total Protein: 7 g/dL (ref 6.5–8.1)

## 2021-05-27 LAB — BASIC METABOLIC PANEL
Anion gap: 9 (ref 5–15)
BUN: 6 mg/dL (ref 6–20)
CO2: 19 mmol/L — ABNORMAL LOW (ref 22–32)
Calcium: 8.8 mg/dL — ABNORMAL LOW (ref 8.9–10.3)
Chloride: 107 mmol/L (ref 98–111)
Creatinine, Ser: 0.7 mg/dL (ref 0.44–1.00)
GFR, Estimated: >60 mL/min (ref >60)
Glucose, Bld: 103 mg/dL — ABNORMAL HIGH (ref 70–99)
Potassium: 4.2 mmol/L (ref 3.5–5.1)
Sodium: 135 mmol/L (ref 135–145)

## 2021-05-27 LAB — LIPASE, BLOOD: Lipase: 21 U/L (ref 11–51)

## 2021-05-27 MED ORDER — ONDANSETRON HCL 4 MG/2ML IJ SOLN: 4.0000 mg | Freq: Four times a day (QID) | INTRAMUSCULAR | Status: DC | PRN

## 2021-05-27 MED ORDER — KETOROLAC TROMETHAMINE 30 MG/ML IJ SOLN
30.0000 mg | Freq: Once | INTRAMUSCULAR | Status: AC
  Administered 2021-05-27: 30 mg via INTRAVENOUS
  Filled 2021-05-27: qty 1

## 2021-05-27 MED ORDER — IOHEXOL 350 MG/ML SOLN
80.0000 mL | Freq: Once | INTRAVENOUS | Status: AC | PRN
  Administered 2021-05-27: 80 mL via INTRAVENOUS

## 2021-05-27 MED ORDER — HYDROMORPHONE HCL 1 MG/ML IJ SOLN
1.0000 mg | Freq: Once | INTRAMUSCULAR | Status: AC
  Administered 2021-05-27: 1 mg via INTRAVENOUS
  Filled 2021-05-27: qty 1

## 2021-05-27 MED ORDER — ACETAMINOPHEN 325 MG PO TABS
650.0000 mg | ORAL_TABLET | Freq: Four times a day (QID) | ORAL | Status: DC | PRN
  Administered 2021-05-28 (×2): 650 mg via ORAL
  Filled 2021-05-27 (×3): qty 2

## 2021-05-27 MED ORDER — ACETAMINOPHEN 650 MG RE SUPP: 650.0000 mg | Freq: Four times a day (QID) | RECTAL | Status: DC | PRN

## 2021-05-27 MED ORDER — HYDROMORPHONE HCL 1 MG/ML IJ SOLN: 1.0000 mg | Freq: Once | INTRAMUSCULAR | Status: DC

## 2021-05-27 MED ORDER — HYDROMORPHONE HCL 1 MG/ML IJ SOLN
0.5000 mg | INTRAMUSCULAR | Status: DC | PRN
  Administered 2021-05-27 – 2021-05-28 (×4): 0.5 mg via INTRAVENOUS
  Filled 2021-05-27 (×4): qty 1

## 2021-05-27 MED ORDER — ONDANSETRON HCL 4 MG/2ML IJ SOLN
4.0000 mg | Freq: Once | INTRAMUSCULAR | Status: AC
  Administered 2021-05-27: 4 mg via INTRAVENOUS
  Filled 2021-05-27: qty 2

## 2021-05-27 MED ORDER — SODIUM CHLORIDE 0.9 % IV SOLN: INTRAVENOUS | Status: DC

## 2021-05-27 MED ORDER — ONDANSETRON HCL 4 MG PO TABS: 4.0000 mg | ORAL_TABLET | Freq: Four times a day (QID) | ORAL | Status: DC | PRN

## 2021-05-27 NOTE — ED Provider Notes (Signed)
[facility] EMERGENCY DEPARTMENT Provider Note   CSN: [id number] Arrival date & time: 05/27/21  0845     History Chief Complaint  Patient presents with   Abdominal Pain    Haley Martinez is a 40 y.o. female with a history of reflux, gastric bypass, polycystic ovarian syndrome, partial hysterectomy, presenting to emergency department with complaint of abdominal pain.  Patient reports abrupt onset of abdominal pain around 4 5 AM this morning that woke her from sleep.  She describes very sharp and stabbing pain in her right upper quadrant and right flank.  She has never had this before.  The pain is maximum intensity.  Nothing makes it better or worse.  She has had several episodes of dry heaving of this.  She denies any history of kidney stones.  She denies recent fevers or chills.  EMS gave the patient a total of 150 mcg of fentanyl in route to the hospital.  HPI     Past Medical History:  Diagnosis Date   Anemia    GERD (gastroesophageal reflux disease)    Headache 05/22/2014   Migraines   History of blood transfusion 04/2014   [facility] - both iron and blood transfusion   Insomnia    Polycystic ovarian syndrome    Vaginal delivery 2000    Patient Active Problem List   Diagnosis Date Noted   SBO (small bowel obstruction) ([place]) 05/27/2021   Small bowel obstruction ([place]) 05/27/2021   Dental caries 03/19/2017   History of prediabetes 10/30/2016   Breast mass, right 10/30/2016   Abnormal weight gain 04/14/2016   PCOS (polycystic ovarian syndrome) 04/14/2016   Thrombocytosis 05/24/2014   Headache 05/22/2014   Iron deficiency anemia 05/15/2014   Other chest pain 05/15/2014   Vitamin D deficiency 05/14/2014   At risk for impaired health maintenance 04/17/2014   B12 deficiency anemia 09/09/2007   Insomnia 08/02/2007    Past Surgical History:  Procedure Laterality Date   BILATERAL SALPINGECTOMY Bilateral 01/16/2017   Procedure: BILATERAL SALPINGECTOMY;   Surgeon: Aletha Halim, MD;  Location: [facility];  Service: Gynecology;  Laterality: Bilateral;   BREAST CYST ASPIRATION Right    CYSTOSCOPY N/A 01/16/2017   Procedure: CYSTOSCOPY;  Surgeon: Aletha Halim, MD;  Location: [facility];  Service: Gynecology;  Laterality: N/A;   GASTRIC BYPASS  2004   REPAIR VAGINAL CUFF N/A 07/20/2017   Procedure: Excision of Granulation Tissue from Vaginal cuff;  Surgeon: Aletha Halim, MD;  Location: [facility];  Service: Gynecology;  Laterality: N/A;   VAGINAL HYSTERECTOMY Bilateral 01/16/2017   Procedure: HYSTERECTOMY VAGINAL;  Surgeon: Aletha Halim, MD;  Location: [facility];  Service: Gynecology;  Laterality: Bilateral;   WISDOM TOOTH EXTRACTION       OB History     Gravida  3   Para  1   Term  1   Preterm  0   AB  2   Living  1      SAB  2   IAB  0   Ectopic  0   Multiple  0   Live Births  1           Family History  Problem Relation Age of Onset   Heart disease Mother    Hyperlipidemia Mother    Hypertension Mother    Hyperlipidemia Father    Hypertension Father    Cancer Maternal Uncle        pancreatic ca   Drug abuse Maternal Grandmother  Drug abuse Paternal Grandmother    Stroke Paternal Grandfather     Social History   Tobacco Use   Smoking status: Never   Smokeless tobacco: Never  Vaping Use   Vaping Use: Never used  Substance Use Topics   Alcohol use: Yes    Alcohol/week: 14.0 standard drinks    Types: 14 Glasses of wine per week    Comment: occ   Drug use: No    Home Medications Prior to Admission medications   Medication Sig Start Date End Date Taking? Authorizing Provider  acetaminophen (TYLENOL) 500 MG tablet Take 1,500 mg by mouth every 6 (six) hours as needed for mild pain or headache.   Yes [provider]  Aspirin-Acetaminophen-Caffeine (GOODY HEADACHE PO) Take 1 packet by mouth 2 (two) times daily as needed (headache).   Yes [provider]  ferrous gluconate (FERGON) 324 MG  tablet Take 1 tablet (324 mg total) by mouth 2 (two) times daily with a meal. Patient taking differently: Take 324 mg by mouth daily. 01/18/17  Yes Aletha Halim, MD  Multiple Vitamins-Minerals (MULTIVITAMIN WITH MINERALS) tablet Take 1 tablet by mouth daily.   Yes [provider]  Nutritional Supplements (DETOX ENZYME FORMULA PO) Take 1 tablet by mouth daily.   Yes [provider]    Allergies    Trazodone hcl and Trazodone and nefazodone  Review of Systems   Review of Systems  Constitutional:  Negative for chills and fever.  Respiratory:  Negative for cough and shortness of breath.   Cardiovascular:  Negative for chest pain and palpitations.  Gastrointestinal:  Positive for abdominal pain, nausea and vomiting.  Genitourinary:  Negative for dysuria and hematuria.  Musculoskeletal:  Negative for arthralgias and myalgias.  Skin:  Negative for color change and rash.  Neurological:  Negative for seizures and syncope.  All other systems reviewed and are negative.  Physical Exam Updated Vital Signs BP 115/76   Pulse 70   Temp 98 F (36.7 C) (Oral)   Resp 11   Ht 5' 5.5" (1.664 m)   Wt 74.8 kg   LMP 12/18/2016 (Exact Date)   SpO2 100%   BMI 27.02 kg/m   Physical Exam Constitutional:      General: She is not in acute distress.    Comments: Appears in pain  HENT:     Head: Normocephalic and atraumatic.  Eyes:     Conjunctiva/sclera: Conjunctivae normal.     Pupils: Pupils are equal, round, and reactive to light.  Cardiovascular:     Rate and Rhythm: Normal rate and regular rhythm.  Pulmonary:     Effort: Pulmonary effort is normal. No respiratory distress.  Abdominal:     General: There is no distension.     Tenderness: There is generalized abdominal tenderness.     Comments: Generalized ttp with most focal TTP in RUQ  Skin:    General: Skin is warm and dry.  Neurological:     General: No focal deficit present.     Mental Status: She is alert.  Mental status is at baseline.  Psychiatric:        Mood and Affect: Mood normal.        Behavior: Behavior normal.    ED Results / Procedures / Treatments   Labs (all labs ordered are listed, but only abnormal results are displayed) Labs Reviewed  BASIC METABOLIC PANEL - Abnormal; Notable for the following components:      Result Value   CO2 19 (*)  Glucose, Bld 103 (*)    Calcium 8.8 (*)    All other components within normal limits  CBC WITH DIFFERENTIAL/PLATELET - Abnormal; Notable for the following components:   Hemoglobin 10.4 (*)    HCT 34.3 (*)    MCV 79.2 (*)    MCH 24.0 (*)    RDW 21.0 (*)    Platelets 441 (*)    Neutro Abs 8.3 (*)    All other components within normal limits  RESP PANEL BY RT-PCR (FLU A&B, COVID) ARPGX2  LIPASE, BLOOD  HEPATIC FUNCTION PANEL  URINALYSIS, ROUTINE W REFLEX MICROSCOPIC  MAGNESIUM    EKG EKG Interpretation  Date/Time:  Friday May 27 2021 08:48:33 EDT Ventricular Rate:  61 PR Interval:  150 QRS Duration: 82 QT Interval:  469 QTC Calculation: 473 R Axis:   72 Text Interpretation: Sinus rhythm Supraventricular bigeminy Confirmed by Octaviano Glow [phone]) on 05/27/2021 8:50:42 AM  Radiology CT ABDOMEN PELVIS W CONTRAST  Result Date: 05/27/2021 CLINICAL DATA:  Right-sided abdominal pain and tenderness EXAM: CT ABDOMEN AND PELVIS WITH CONTRAST TECHNIQUE: Multidetector CT imaging of the abdomen and pelvis was performed using the standard protocol following bolus administration of intravenous contrast. CONTRAST:  57m OMNIPAQUE IOHEXOL 350 MG/ML SOLN COMPARISON:  None. FINDINGS: Lower chest: No acute abnormality. Hepatobiliary: No solid liver abnormality is seen. No gallstones, gallbladder wall thickening, or biliary dilatation. Pancreas: Unremarkable. No pancreatic ductal dilatation or surrounding inflammatory changes. Spleen: Normal in size without significant abnormality. Adrenals/Urinary Tract: Adrenal glands are unremarkable.  Kidneys are normal, without renal calculi, solid lesion, or hydronephrosis. Bladder is unremarkable. 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 Multiple loops of diffusely fluid distended small bowel in the central abdomen and pelvis, maximum caliber 4.0 cm, with abrupt caliber change in the central right hemiabdomen and complete decompression of the ileum (series 3, image 58). There is gas and stool present in the colon to the rectum. Appendix appears normal. Vascular/Lymphatic: No significant vascular findings are present. No enlarged abdominal or pelvic lymph nodes. Reproductive: Status post hysterectomy. Other: No abdominal wall hernia or abnormality. Small volume ascites. Musculoskeletal: No acute or significant osseous findings. IMPRESSION: 1. Multiple loops of diffusely fluid distended small bowel in the central abdomen and pelvis, maximum caliber 4.0 cm, with transition point in the central right hemiabdomen and complete decompression of the ileum. Findings are consistent with partial or developing small bowel obstruction given the presence of gas and stool in the colon. 2. Small volume ascites, likely reactive. 3. 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 4. Status post hysterectomy. Electronically Signed   By: AEddie CandleM.D.   On: 05/27/2021 11:24   DG Abdomen Acute W/Chest  Result Date: 05/27/2021 CLINICAL DATA:  Abdominal pain, evaluate for free air EXAM: DG ABDOMEN ACUTE WITH 1 VIEW CHEST COMPARISON:  None. FINDINGS: Chest: There is no acute cardiopulmonary process. Abdomen: There is mild gaseous distention of multiple loops of bowel in a nonspecific pattern. There are no air-fluid levels. There is no evidence of free intraperitoneal air. Left upper quadrant surgical clips are noted. There is no acute osseous abnormality. IMPRESSION: 1. No evidence of free intraperitoneal air. 2. Mild gaseous distention of bowel in a nonspecific pattern. Electronically Signed    By: PValetta MoleM.D.   On: 05/27/2021 10:07    Procedures Procedures   Medications Ordered in ED Medications  HYDROmorphone (DILAUDID) injection 1 mg (1 mg Intravenous Given 05/27/21 0900)  ondansetron (ZOFRAN) injection 4 mg (4 mg Intravenous  Given 05/27/21 0900)  HYDROmorphone (DILAUDID) injection 1 mg (1 mg Intravenous Given 05/27/21 1033)  ketorolac (TORADOL) 30 MG/ML injection 30 mg (30 mg Intravenous Given 05/27/21 1033)  iohexol (OMNIPAQUE) 350 MG/ML injection 80 mL (80 mLs Intravenous Contrast Given 05/27/21 1052)    ED Course  I have reviewed the triage vital signs and the nursing notes.  Pertinent labs & imaging results that were available during my care of the patient were reviewed by me and considered in my medical decision making (see chart for details).  This patient presents to the Emergency Department with complaint of abdominal pain. This involves an extensive number of treatment options, and is a complaint that carries with it a high risk of complications and morbidity.  The differential diagnosis includes, but is not limited to, gastritis vs biliary disease vs peptic ulcer vs constipation vs colitis vs UTI vs appendicitis vs other  I ordered, reviewed, and interpreted labs, showing BMP, CBC, LFT, Lipase, which were largely unremarkable I ordered medication for abdominal pain and/or nausea I ordered imaging studies which included CT abdomen pelvis with contrast I independently visualized and interpreted imaging which showed partial SBO and the monitor tracing which showed NSR Previous records obtained and reviewed showing last pelvic ultrasound 12/07/16 pre hysterectomy showing normal ovaries, uterine fibroids  Clinical Course as of 05/27/21 1329  Fri May 27, 2021  0850 EKG per my review shows a sinus rhythm no acute ischemic findings. [MT]  1012 Blood tests are largely unremarkable.  I have ordered a CT scan. [MT]  1146 IMPRESSION: 1. Multiple loops of diffusely fluid  distended small bowel in the central abdomen and pelvis, maximum caliber 4.0 cm, with transition point in the central right hemiabdomen and complete decompression of the ileum. Findings are consistent with partial or developing small bowel obstruction given the presence of gas and stool in the colon. 2. Small volume ascites, likely reactive. 3. 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 4. Status post hysterectomy.  [MT]  [facility] PA consulted, rec medical admission they will follow along [MT]  1233 Paged for admission [MT]    Clinical Course User Index [MT] [place] Code, Carola Rhine, MD   Admitted to hospitalist   Final Clinical Impression(s) / ED Diagnoses Final diagnoses:  Partial intestinal obstruction, unspecified cause [facility])    Rx / DC Orders ED Discharge Orders     None        Wyvonnia Dusky, MD 05/27/21 1329

## 2021-05-27 NOTE — Progress Notes (Signed)
Patient received to the unit. Patient is alert and oriented x 4. Vital signs are stable. Iv in place and running fluid. Skin assessment done with another nurse. Given instructions about call bell and phone. Bed in low position and call bell in reach.

## 2021-05-27 NOTE — ED Triage Notes (Signed)
Pt arrived via [facility] from home for c/o generalized abdominal pain that awoke her out of sleep 0500 today. Pt also has nausea. Pt is A&Ox4. EMS gave fentanyl 150 mcg IV. Pt is still whincing in pain.

## 2021-05-27 NOTE — H&P (Signed)
History and Physical    Haley Martinez [id number] DOB: 11/26/1980 DOA: 05/27/2021  PCP: Pcp, No Consultants:   Patient coming from:  Home - lives with   Chief Complaint: abdominal pain   HPI: Haley Martinez is a 40 y.o. 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 She states the pain woke her up from sleep. She describes the pain as sharp and stabbing in  nature and located in her RUQ and umbilicus area and radiated down into her LLQ. She also had associated nausea and vomiting. She had 4 episodes of emesis at home.  She rates the pain as 20/10, unbearable and constant. Her last BM was this morning and normal. No blood in stool. She has not had any gas.  She has never had a colonscopy. Besides gastic bypass she had a hysterectomy about 4 years ago.   She denies any headaches, vision changes, chest pain, palpitations, shortness of breath, cough, dysuria, urinary frequency or urgency, leg swelling.  ED Course: vitals: afebrile, bp: 139/92, HR: 59, RR: 18 and oxygen: 94% RA  Pertinent labs: Hgb: 10.4 (9.1-11.5), Platelets: 441. No leukocytosis.  Abdominal xray: no acute findings. Abdominal CT: Multiple loops of diffusely fluid distended small bowel in the central abdomen and pelvis, maximum caliber 4.0 cm, with transition point in the central right hemiabdomen and complete decompression of the ileum. Findings are consistent with partial or developing small bowel obstruction given the presence of gas and stool in the colon. Given toradol, dilaudid and zofran in ER. Called and asked to admit.   Review of Systems: As per HPI; otherwise review of systems reviewed and negative.   Ambulatory Status:  Ambulates without assistance   Past Medical History:  Diagnosis Date   Anemia    GERD (gastroesophageal reflux disease)     Headache 05/22/2014   Migraines   History of blood transfusion 04/2014   [facility] - both iron and blood transfusion   Insomnia    Polycystic ovarian syndrome    Vaginal delivery 2000    Past Surgical History:  Procedure Laterality Date   BILATERAL SALPINGECTOMY Bilateral 01/16/2017   Procedure: BILATERAL SALPINGECTOMY;  Surgeon: Haley Halim, MD;  Location: [facility];  Service: Gynecology;  Laterality: Bilateral;   BREAST CYST ASPIRATION Right    CYSTOSCOPY N/A 01/16/2017   Procedure: CYSTOSCOPY;  Surgeon: Haley Halim, MD;  Location: [facility];  Service: Gynecology;  Laterality: N/A;   GASTRIC BYPASS  2004   REPAIR VAGINAL CUFF N/A 07/20/2017   Procedure: Excision of Granulation Tissue from Vaginal cuff;  Surgeon: Haley Halim, MD;  Location: [facility];  Service: Gynecology;  Laterality: N/A;   VAGINAL HYSTERECTOMY Bilateral 01/16/2017   Procedure: HYSTERECTOMY VAGINAL;  Surgeon: Haley Halim, MD;  Location: [facility];  Service: Gynecology;  Laterality: Bilateral;   WISDOM TOOTH EXTRACTION      Social History   Socioeconomic History   Marital status: Single    Spouse name: Not on file   Number of children: Not on file   Years of education: Not on file   Highest education level: Not on file  Occupational History   Not on file  Tobacco Use   Smoking status: Never   Smokeless tobacco: Never  Vaping Use   Vaping Use: Never used  Substance and Sexual Activity   Alcohol use: Yes  Alcohol/week: 14.0 standard drinks    Types: 14 Glasses of wine per week    Comment: occ   Drug use: No   Sexual activity: Yes    Birth control/protection: None    Comment: hysterectomy  Other Topics Concern   Not on file  Social History Narrative   Not on file   Social Determinants of Health   Financial Resource Strain: Not on file  Food Insecurity: Not on file  Transportation Needs: Not on file  Physical Activity: Not on file  Stress: Not on file  Social Connections: Not on file   Intimate Partner Violence: Not on file    Allergies  Allergen Reactions   Trazodone Hcl     Other reaction(s): Other, Other (See Comments) "severe headache" "severe headache"    Trazodone And Nefazodone     "severe headache"    Family History  Problem Relation Age of Onset   Heart disease Mother    Hyperlipidemia Mother    Hypertension Mother    Hyperlipidemia Father    Hypertension Father    Cancer Maternal Uncle        pancreatic ca   Drug abuse Maternal Grandmother    Drug abuse Paternal Grandmother    Stroke Paternal Grandfather     Prior to Admission medications   Medication Sig Start Date End Date Taking? Authorizing Provider  acetaminophen (TYLENOL) 500 MG tablet Take 1,500 mg by mouth every 6 (six) hours as needed for mild pain or headache.   Yes [provider]  Aspirin-Acetaminophen-Caffeine (GOODY HEADACHE PO) Take 1 packet by mouth 2 (two) times daily as needed (headache).   Yes [provider]  ferrous gluconate (FERGON) 324 MG tablet Take 1 tablet (324 mg total) by mouth 2 (two) times daily with a meal. Patient taking differently: Take 324 mg by mouth daily. 01/18/17  Yes Haley Halim, MD  Multiple Vitamins-Minerals (MULTIVITAMIN WITH MINERALS) tablet Take 1 tablet by mouth daily.   Yes [provider]  Nutritional Supplements (DETOX ENZYME FORMULA PO) Take 1 tablet by mouth daily.   Yes [provider]    Physical Exam: Vitals:   05/27/21 1115 05/27/21 1130 05/27/21 1224 05/27/21 1300  BP: 117/78 112/63 106/77 115/76  Pulse: 72 72 68 70  Resp: '17 13 17 11  '$ Temp:      TempSrc:      SpO2: 93% 100% 98% 100%  Weight:      Height:         General:  Appears calm and comfortable and is in NAD Eyes:  PERRL, EOMI, normal lids, iris ENT:  grossly normal hearing, lips & tongue, mmm; appropriate dentition Neck:  no LAD, masses or thyromegaly; no carotid bruits Cardiovascular:  RRR, no m/r/g. No LE edema.   Respiratory:   CTA bilaterally with no wheezes/rales/rhonchi.  Normal respiratory effort. Abdomen:  soft, TTP RUQ, RLQ, LLQ and umbilical area. No rebound or guarding. BS decreased. Non distended.  Back:   normal alignment, no CVAT Skin:  no rash or induration seen on limited exam Musculoskeletal:  grossly normal tone BUE/BLE, good ROM, no bony abnormality Lower extremity:  No LE edema.  Limited foot exam with no ulcerations.  2+ distal pulses. Psychiatric:  grossly normal mood and affect, speech fluent and appropriate, AOx3 Neurologic:  CN 2-12 grossly intact, moves all extremities in coordinated fashion, sensation intact    Radiological Exams on Admission: Independently reviewed - see discussion in A/P where applicable  CT ABDOMEN PELVIS W  CONTRAST  Result Date: 05/27/2021 CLINICAL DATA:  Right-sided abdominal pain and tenderness EXAM: CT ABDOMEN AND PELVIS WITH CONTRAST TECHNIQUE: Multidetector CT imaging of the abdomen and pelvis was performed using the standard protocol following bolus administration of intravenous contrast. CONTRAST:  49m OMNIPAQUE IOHEXOL 350 MG/ML SOLN COMPARISON:  None. FINDINGS: Lower chest: No acute abnormality. Hepatobiliary: No solid liver abnormality is seen. No gallstones, gallbladder wall thickening, or biliary dilatation. Pancreas: Unremarkable. No pancreatic ductal dilatation or surrounding inflammatory changes. Spleen: Normal in size without significant abnormality. Adrenals/Urinary Tract: Adrenal glands are unremarkable. Kidneys are normal, without renal calculi, solid lesion, or hydronephrosis. Bladder is unremarkable. 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 Multiple loops of diffusely fluid distended small bowel in the central abdomen and pelvis, maximum caliber 4.0 cm, with abrupt caliber change in the central right hemiabdomen and complete decompression of the ileum (series 3, image 58). There is gas and stool present in the colon  to the rectum. Appendix appears normal. Vascular/Lymphatic: No significant vascular findings are present. No enlarged abdominal or pelvic lymph nodes. Reproductive: Status post hysterectomy. Other: No abdominal wall hernia or abnormality. Small volume ascites. Musculoskeletal: No acute or significant osseous findings. IMPRESSION: 1. Multiple loops of diffusely fluid distended small bowel in the central abdomen and pelvis, maximum caliber 4.0 cm, with transition point in the central right hemiabdomen and complete decompression of the ileum. Findings are consistent with partial or developing small bowel obstruction given the presence of gas and stool in the colon. 2. Small volume ascites, likely reactive. 3. 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 4. Status post hysterectomy. Electronically Signed   By: AEddie CandleM.D.   On: 05/27/2021 11:24   DG Abdomen Acute W/Chest  Result Date: 05/27/2021 CLINICAL DATA:  Abdominal pain, evaluate for free air EXAM: DG ABDOMEN ACUTE WITH 1 VIEW CHEST COMPARISON:  None. FINDINGS: Chest: There is no acute cardiopulmonary process. Abdomen: There is mild gaseous distention of multiple loops of bowel in a nonspecific pattern. There are no air-fluid levels. There is no evidence of free intraperitoneal air. Left upper quadrant surgical clips are noted. There is no acute osseous abnormality. IMPRESSION: 1. No evidence of free intraperitoneal air. 2. Mild gaseous distention of bowel in a nonspecific pattern. Electronically Signed   By: PValetta MoleM.D.   On: 05/27/2021 10:07    EKG: Independently reviewed.  NSR with rate 61, supraventricular bigeminy; nonspecific ST changes with no evidence of acute ischemia. Slower and bigeminy new.    Labs on Admission: I have personally reviewed the available labs and imaging studies at the time of the admission.  Pertinent labs:  Hgb: 10.4 (9.1-11.5) Platelets: 441   Assessment/Plan Principal Problem:   SBO (small bowel  obstruction) (HIndiahoma 40year old female with acute onset of abdominal pain nausea and vomiting with CT findings consistent with partial or developing small bowel obstruction.  Likely secondary to adhesions with history of gastric bypass surgery. -Admit to MedSurg and make n.p.o. -no clinical findings for emergent surgery at this time  -Gentle IV fluid hydration -Pain medication -encourage ambulation -Monitor electrolytes -General surgery consulted and will place consult note but not a surgical candidate at this time and we can manage conservatively -She has not been vomiting while here so no NG tube placed at this time, if any vomiting, will need NG tube.   Active Problems:   Iron deficiency anemia -Stable around her baseline at 10.4.  Seen by hematology back in 2016 for iron deficiency anemia. -  Hold her oral iron and multivitamin  while n.p.o. as can upset belly without food -We will restart when diet advanced    Thrombocytosis  -History of thrombocytosis and likely secondary to iron deficiency anemia -Continue to monitor and replace iron    Body mass index is 27.02 kg/m.   Level of care: Med-Surg DVT prophylaxis:  SCDs Code Status:  Full - confirmed with patient Family Communication: None present Disposition Plan:  The patient is from: home  Anticipated d/c is to: home Requires inpatient hospitalization and is at significant risk of  worsening, requires constant monitoring, IV fluids and medication and assessment and MDM with specialists.  Patient is currently: acutely ill Consults called: consult by general surgery   Admission status:  observation    Dragon dictation used in completing this note.    Orma Flaming MD [facility]   How to contact the [facility] Attending or Consulting provider [place] or covering provider during after hours [place], for this patient?  Check the care team in [facility] and look for a) attending/consulting [facility] provider listed and b) the [facility] team  listed Log into [email or web address] and use [place]'s universal password to access. If you do not have the password, please contact the hospital operator. Locate the [facility] provider you are looking for under [facility] and page to a number that you can be directly reached. If you still have difficulty reaching the provider, please page the [facility] (Director on Call) for the Hospitalists listed on amion for assistance.   05/27/2021, 1:48 PM

## 2021-05-27 NOTE — Consult Note (Signed)
Consult Note  Haley Martinez November 06, 1980  [id number].    Requesting MD: Dr. Langston Masker Chief Complaint/Reason for Consult: Abdominal pain, SBO  HPI: 40 year old female with a prior surgical hx of Gastric bypass (2004 - In [place]) and hysterectomy who presented to the [facility] emergency department due to abdominal pain.  Patient reports that she awoke at 430am with acute onset of sharp abdominal pain located diffusely in her upper abdomen radiating to her left flank. This was associated with nausea and several episodes of emesis. She did not try anything for this. Nothing seems to make this better or worse.  She denies hx of similar symptoms in the past. She has not passed flatus or had a bm since onset of symptoms. Her last bm was last night and normal.   In the ED patient was afebrile, without tachycardia or hypotension. WBC 9.6. CT A/P with multiple loops of diffusely fluid distended small bowel and central abdomen and pelvis with transition point in the central right hemiabdomen and complete decompression of ileum. [facility] admitted. We were asked to see.  Since receiving pain and nausea medication in the ED her pain has improved from 10/10 to a 6/10 and is now cramping than sharp. She reports her nausea has resolved. Patient has had > 100lb weight loss since initial gastric surgery. She takes her multivitamin as recommended. She does report that she uses BC powder 3x/week for HA's.   ROS: Review of Systems  Constitutional:  Negative for chills and fever.  Respiratory:  Negative for cough and shortness of breath.   Cardiovascular:  Negative for chest pain.  Gastrointestinal:  Positive for abdominal pain and nausea. Negative for vomiting.  Genitourinary:  Negative for dysuria and hematuria.  Psychiatric/Behavioral:  Negative for substance abuse.   All other systems reviewed and are negative.  Family History  Problem Relation Age of Onset   Heart disease Mother    Hyperlipidemia Mother     Hypertension Mother    Hyperlipidemia Father    Hypertension Father    Cancer Maternal Uncle        pancreatic ca   Drug abuse Maternal Grandmother    Drug abuse Paternal Grandmother    Stroke Paternal Grandfather     Past Medical History:  Diagnosis Date   Anemia    GERD (gastroesophageal reflux disease)    Headache 05/22/2014   Migraines   History of blood transfusion 04/2014   [facility] - both iron and blood transfusion   Insomnia    Polycystic ovarian syndrome    Vaginal delivery 2000    Past Surgical History:  Procedure Laterality Date   BILATERAL SALPINGECTOMY Bilateral 01/16/2017   Procedure: BILATERAL SALPINGECTOMY;  Surgeon: Aletha Halim, MD;  Location: [facility];  Service: Gynecology;  Laterality: Bilateral;   BREAST CYST ASPIRATION Right    CYSTOSCOPY N/A 01/16/2017   Procedure: CYSTOSCOPY;  Surgeon: Aletha Halim, MD;  Location: [facility];  Service: Gynecology;  Laterality: N/A;   GASTRIC BYPASS  2004   REPAIR VAGINAL CUFF N/A 07/20/2017   Procedure: Excision of Granulation Tissue from Vaginal cuff;  Surgeon: Aletha Halim, MD;  Location: [facility];  Service: Gynecology;  Laterality: N/A;   VAGINAL HYSTERECTOMY Bilateral 01/16/2017   Procedure: HYSTERECTOMY VAGINAL;  Surgeon: Aletha Halim, MD;  Location: [facility];  Service: Gynecology;  Laterality: Bilateral;   WISDOM TOOTH EXTRACTION      Social History:  reports that she has never smoked. She has never used smokeless tobacco.  She reports current alcohol use of about 14.0 standard drinks per week. She reports that she does not use drugs. 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 rate 17, height 5' 5.5" (1.664 m), weight 74.8 kg, last menstrual period 12/18/2016, SpO2 98 %. Physical Exam:  General: pleasant, WD, female who is laying in bed in NAD HEENT: head is normocephalic, atraumatic.  Sclera are noninjected.  PERRL.  Ears and nose without any masses or lesions.  Mouth is pink and moist Heart: regular, rate, and rhythm.  Normal s1,s2. No obvious murmurs, gallops, or rubs noted.  Palpable radial and pedal pulses bilaterally Lungs: CTAB, no wheezes, rhonchi, or rales noted.  Respiratory effort nonlabored Abd: Mild distension but very soft, upper abdominal tenderness without rebound, rigidity or guarding. Hypoactive bowel sounds. No masses, hernias, or organomegaly. Prior laparoscopic incisions are well healed.  MS: all 4 extremities are symmetrical with no cyanosis, clubbing, or edema. Skin: warm and dry with no masses, lesions, or rashes Neuro: Cranial nerves 2-12 grossly intact, sensation is normal throughout, moves all extremities, gait not assessed. Psych: A&Ox3 with an appropriate affect.   Results for orders placed or performed during the hospital encounter of 05/27/21 (from the past 48 hour(s))  Basic metabolic panel     Status: Abnormal   Collection Time: 05/27/21  8:55 AM  Result Value Ref Range   Sodium 135 135 - 145 mmol/L   Potassium 4.2 3.5 - 5.1 mmol/L   Chloride 107 98 - 111 mmol/L   CO2 19 (L) 22 - 32 mmol/L   Glucose, Bld 103 (H) 70 - 99 mg/dL    Comment: Glucose reference range applies only to samples taken after fasting for at least 8 hours.   BUN 6 6 - 20 mg/dL   Creatinine, Ser 0.70 0.44 - 1.00 mg/dL   Calcium 8.8 (L) 8.9 - 10.3 mg/dL   GFR, Estimated >60 >60 mL/min    Comment: (NOTE) Calculated using the CKD-EPI Creatinine Equation (2021)    Anion gap 9 5 - 15    Comment: Performed at [facility] [address] [place], [place] [address]  CBC with Differential      Status: Abnormal   Collection Time: 05/27/21  8:55 AM  Result Value Ref Range   WBC 9.6 4.0 - 10.5 K/uL   RBC 4.33 3.87 - 5.11 MIL/uL   Hemoglobin 10.4 (L) 12.0 - 15.0 g/dL   HCT 34.3 (L) 36.0 - 46.0 %   MCV 79.2 (L) 80.0 - 100.0 fL   MCH 24.0 (L) 26.0 - 34.0 pg   MCHC 30.3 30.0 - 36.0 g/dL   RDW 21.0 (H) 11.5 - 15.5 %   Platelets 441 (H) 150 - 400 K/uL   nRBC 0.0 0.0 - 0.2 %   Neutrophils Relative % 87 %   Neutro Abs 8.3 (H) 1.7 - 7.7 K/uL   Lymphocytes Relative 10 %   Lymphs Abs 1.0 0.7 - 4.0 K/uL   Monocytes Relative 3 %   Monocytes Absolute 0.2 0.1 - 1.0 K/uL   Eosinophils Relative  0 %   Eosinophils Absolute 0.0 0.0 - 0.5 K/uL   Basophils Relative 0 %   Basophils Absolute 0.0 0.0 - 0.1 K/uL   Immature Granulocytes 0 %   Abs Immature Granulocytes 0.03 0.00 - 0.07 K/uL    Comment: Performed at [facility], [place] [address], [place], [place] [address]  Lipase, blood     Status: None   Collection Time: 05/27/21  8:55 AM  Result Value Ref Range   Lipase 21 11 - 51 U/L    Comment: Performed at [facility] [address] [place], [place] [address]  Hepatic function panel     Status: None   Collection Time: 05/27/21  8:55 AM  Result Value Ref Range   Total Protein 7.0 6.5 - 8.1 g/dL   Albumin 3.9 3.5 - 5.0 g/dL   AST 36 15 - 41 U/L   ALT 21 0 - 44 U/L   Alkaline Phosphatase 64 38 - 126 U/L   Total Bilirubin 0.6 0.3 - 1.2 mg/dL   Bilirubin, Direct <0.1 0.0 - 0.2 mg/dL   Indirect Bilirubin NOT CALCULATED 0.3 - 0.9 mg/dL    Comment: Performed at [facility] [address], [place], [place] [address]   CT ABDOMEN PELVIS W CONTRAST  Result Date: 05/27/2021 CLINICAL DATA:  Right-sided abdominal pain and tenderness EXAM: CT ABDOMEN AND PELVIS WITH CONTRAST TECHNIQUE: Multidetector CT imaging of the abdomen and pelvis was performed using the standard protocol following bolus administration of intravenous contrast. CONTRAST:  86m OMNIPAQUE IOHEXOL 350 MG/ML SOLN  COMPARISON:  None. FINDINGS: Lower chest: No acute abnormality. Hepatobiliary: No solid liver abnormality is seen. No gallstones, gallbladder wall thickening, or biliary dilatation. Pancreas: Unremarkable. No pancreatic ductal dilatation or surrounding inflammatory changes. Spleen: Normal in size without significant abnormality. Adrenals/Urinary Tract: Adrenal glands are unremarkable. Kidneys are normal, without renal calculi, solid lesion, or hydronephrosis. Bladder is unremarkable. 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 Multiple loops of diffusely fluid distended small bowel in the central abdomen and pelvis, maximum caliber 4.0 cm, with abrupt caliber change in the central right hemiabdomen and complete decompression of the ileum (series 3, image 58). There is gas and stool present in the colon to the rectum. Appendix appears normal. Vascular/Lymphatic: No significant vascular findings are present. No enlarged abdominal or pelvic lymph nodes. Reproductive: Status post hysterectomy. Other: No abdominal wall hernia or abnormality. Small volume ascites. Musculoskeletal: No acute or significant osseous findings. IMPRESSION: 1. Multiple loops of diffusely fluid distended small bowel in the central abdomen and pelvis, maximum caliber 4.0 cm, with transition point in the central right hemiabdomen and complete decompression of the ileum. Findings are consistent with partial or developing small bowel obstruction given the presence of gas and stool in the colon. 2. Small volume ascites, likely reactive. 3. 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 4. Status post hysterectomy. Electronically Signed   By: AEddie CandleM.D.   On: 05/27/2021 11:24   DG Abdomen Acute W/Chest  Result Date: 05/27/2021 CLINICAL DATA:  Abdominal pain, evaluate for free air EXAM: DG ABDOMEN ACUTE WITH 1 VIEW CHEST COMPARISON:  None. FINDINGS: Chest: There is no acute cardiopulmonary process. Abdomen: There is  mild gaseous distention of multiple loops of bowel in a nonspecific pattern. There are no air-fluid levels. There is no evidence of free intraperitoneal air. Left upper quadrant surgical clips are noted. There is no acute osseous abnormality. IMPRESSION: 1. No evidence of free intraperitoneal  air. 2. Mild gaseous distention of bowel in a nonspecific pattern. Electronically Signed   By: Valetta Mole M.D.   On: 05/27/2021 10:07    Anti-infectives (From admission, onward)    None        Assessment/Plan SBO - CT w/ multiple loops of diffusely fluid distended small bowel and central abdomen and pelvis with transition point in the central right hemiabdomen and complete decompression of ileum. Reviewed imaging with my attending.  - She has been afebrile, without tachycardia or hypotension. WBC 9.6. There is no peritonitis on exam and her pain has improved. While this is reassuring, will review CT scan with one of our bariatric surgeons who is familiar with her anatomy given she is at increased risk for internal hernia w/ hx of gastric bypass and to ensure she does not need a diagnostic laparoscopy.  - Further recommendations to follow. Please keep NPO. Can hold off on NGT as patients nausea has resolved.   FEN: NPO, IVF per primary ID: None indicated from our standpoint VTE: Okay for chemical prophylaxis from surgical standpoint  To note patient reports etoh use and BC powder use at least 3 times a week. Recommend cessation of nsaids to decrease risk of ulcer.   Winferd Humphrey, [facility] 05/27/2021, 12:29 PM Please see Amion for pager number during day hours 7:00am-4:30pm

## 2021-05-27 NOTE — ED Notes (Signed)
Patient transported to CT 

## 2021-05-27 NOTE — ED Notes (Signed)
Patient transported to X-ray 

## 2021-05-27 NOTE — ED Notes (Signed)
Report called; awaiting clean room.

## 2021-05-28 DIAGNOSIS — D75839 Thrombocytosis, unspecified: Secondary | ICD-10-CM

## 2021-05-28 DIAGNOSIS — K56609 Unspecified intestinal obstruction, unspecified as to partial versus complete obstruction: Secondary | ICD-10-CM

## 2021-05-28 DIAGNOSIS — Z9884 Bariatric surgery status: Secondary | ICD-10-CM

## 2021-05-28 DIAGNOSIS — D500 Iron deficiency anemia secondary to blood loss (chronic): Secondary | ICD-10-CM

## 2021-05-28 LAB — MAGNESIUM: Magnesium: 1.9 mg/dL (ref 1.7–2.4)

## 2021-05-28 LAB — CBC WITH DIFFERENTIAL/PLATELET
Abs Immature Granulocytes: 0.01 10*3/uL (ref 0.00–0.07)
Basophils Absolute: 0 10*3/uL (ref 0.0–0.1)
Basophils Relative: 0 %
Eosinophils Absolute: 0.1 10*3/uL (ref 0.0–0.5)
Eosinophils Relative: 1 %
HCT: 29 % — ABNORMAL LOW (ref 36.0–46.0)
Hemoglobin: 9 g/dL — ABNORMAL LOW (ref 12.0–15.0)
Immature Granulocytes: 0 %
Lymphocytes Relative: 33 %
Lymphs Abs: 1.7 10*3/uL (ref 0.7–4.0)
MCH: 24.4 pg — ABNORMAL LOW (ref 26.0–34.0)
MCHC: 31 g/dL (ref 30.0–36.0)
MCV: 78.6 fL — ABNORMAL LOW (ref 80.0–100.0)
Monocytes Absolute: 0.3 10*3/uL (ref 0.1–1.0)
Monocytes Relative: 6 %
Neutro Abs: 3.1 10*3/uL (ref 1.7–7.7)
Neutrophils Relative %: 60 %
Platelets: 336 10*3/uL (ref 150–400)
RBC: 3.69 MIL/uL — ABNORMAL LOW (ref 3.87–5.11)
RDW: 20.5 % — ABNORMAL HIGH (ref 11.5–15.5)
WBC: 5.1 10*3/uL (ref 4.0–10.5)
nRBC: 0 % (ref 0.0–0.2)

## 2021-05-28 LAB — BASIC METABOLIC PANEL
Anion gap: 6 (ref 5–15)
BUN: 10 mg/dL (ref 6–20)
CO2: 24 mmol/L (ref 22–32)
Calcium: 8.4 mg/dL — ABNORMAL LOW (ref 8.9–10.3)
Chloride: 107 mmol/L (ref 98–111)
Creatinine, Ser: 0.61 mg/dL (ref 0.44–1.00)
GFR, Estimated: >60 mL/min (ref >60)
Glucose, Bld: 85 mg/dL (ref 70–99)
Potassium: 4 mmol/L (ref 3.5–5.1)
Sodium: 137 mmol/L (ref 135–145)

## 2021-05-28 LAB — HIV ANTIBODY (ROUTINE TESTING W REFLEX): HIV Screen 4th Generation wRfx: NONREACTIVE

## 2021-05-28 MED ORDER — OXYCODONE HCL 5 MG PO TABS
5.0000 mg | ORAL_TABLET | ORAL | Status: DC | PRN
  Administered 2021-05-29: 5 mg via ORAL
  Filled 2021-05-28: qty 1

## 2021-05-28 MED ORDER — OXYCODONE-ACETAMINOPHEN 5-325 MG PO TABS
1.0000 | ORAL_TABLET | Freq: Once | ORAL | Status: AC
  Administered 2021-05-28: 1 via ORAL
  Filled 2021-05-28: qty 1

## 2021-05-28 MED ORDER — MORPHINE SULFATE (PF) 2 MG/ML IV SOLN
2.0000 mg | INTRAVENOUS | Status: DC | PRN
  Administered 2021-05-28: 2 mg via INTRAVENOUS
  Filled 2021-05-28: qty 1

## 2021-05-28 NOTE — Progress Notes (Signed)
Patient ID: Haley Martinez, female   DOB: [birth year], 40 y.o.   MRN: [id number] [facility] Surgery Progress Note:   * No surgery found *  Subjective: Mental status is clear.  Complaints migraine headache and itching from dilaudid. Objective: Vital signs in last 24 hours: Temp:  [97.7 F (36.5 C)-99.3 F (37.4 C)] 97.7 F (36.5 C) (09/03 0514) Pulse Rate:  [53-86] 78 (09/03 0514) Resp:  [11-20] 17 (09/03 0514) BP: (99-127)/(63-97) 127/81 (09/03 0514) SpO2:  [93 %-100 %] 100 % (09/03 0514)  Intake/Output from previous day: 09/02 0701 - 09/03 0700 In: 367.5 [I.V.:367.5] Out: -  Intake/Output this shift: No intake/output data recorded.  Physical Exam: Work of breathing is normal.  Abdomen is nontender and she is passing flatus.  Wants liquids  Lab Results:  Results for orders placed or performed during the hospital encounter of 05/27/21 (from the past 48 hour(s))  Basic metabolic panel     Status: Abnormal   Collection Time: 05/27/21  8:55 AM  Result Value Ref Range   Sodium 135 135 - 145 mmol/L   Potassium 4.2 3.5 - 5.1 mmol/L   Chloride 107 98 - 111 mmol/L   CO2 19 (L) 22 - 32 mmol/L   Glucose, Bld 103 (H) 70 - 99 mg/dL    Comment: Glucose reference range applies only to samples taken after fasting for at least 8 hours.   BUN 6 6 - 20 mg/dL   Creatinine, Ser 0.70 0.44 - 1.00 mg/dL   Calcium 8.8 (L) 8.9 - 10.3 mg/dL   GFR, Estimated >60 >60 mL/min    Comment: (NOTE) Calculated using the CKD-EPI Creatinine Equation (2021)    Anion gap 9 5 - 15    Comment: Performed at [facility] [address] [place], [place] [address]  CBC with Differential     Status: Abnormal   Collection Time: 05/27/21  8:55 AM  Result Value Ref Range   WBC 9.6 4.0 - 10.5 K/uL   RBC 4.33 3.87 - 5.11 MIL/uL   Hemoglobin 10.4 (L) 12.0 - 15.0 g/dL   HCT 34.3 (L) 36.0 - 46.0 %   MCV 79.2 (L) 80.0 - 100.0 fL   MCH 24.0 (L) 26.0 - 34.0 pg   MCHC 30.3 30.0 - 36.0 g/dL   RDW 21.0 (H) 11.5 -  15.5 %   Platelets 441 (H) 150 - 400 K/uL   nRBC 0.0 0.0 - 0.2 %   Neutrophils Relative % 87 %   Neutro Abs 8.3 (H) 1.7 - 7.7 K/uL   Lymphocytes Relative 10 %   Lymphs Abs 1.0 0.7 - 4.0 K/uL   Monocytes Relative 3 %   Monocytes Absolute 0.2 0.1 - 1.0 K/uL   Eosinophils Relative 0 %   Eosinophils Absolute 0.0 0.0 - 0.5 K/uL   Basophils Relative 0 %   Basophils Absolute 0.0 0.0 - 0.1 K/uL   Immature Granulocytes 0 %   Abs Immature Granulocytes 0.03 0.00 - 0.07 K/uL    Comment: Performed at [facility], [place] [address] [place], [place] [address]  Lipase, blood     Status: None   Collection Time: 05/27/21  8:55 AM  Result Value Ref Range   Lipase 21 11 - 51 U/L    Comment: Performed at [facility] [address], [place],  [address]  Hepatic function panel     Status: None   Collection Time: 05/27/21  8:55 AM  Result Value Ref  Range   Total Protein 7.0 6.5 - 8.1 g/dL   Albumin 3.9 3.5 - 5.0 g/dL   AST 36 15 - 41 U/L   ALT 21 0 - 44 U/L   Alkaline Phosphatase 64 38 - 126 U/L   Total Bilirubin 0.6 0.3 - 1.2 mg/dL   Bilirubin, Direct <0.1 0.0 - 0.2 mg/dL   Indirect Bilirubin NOT CALCULATED 0.3 - 0.9 mg/dL    Comment: Performed at [facility] [address] [place], [place] [address]  Magnesium     Status: None   Collection Time: 05/27/21  8:55 AM  Result Value Ref Range   Magnesium 2.1 1.7 - 2.4 mg/dL    Comment: Performed at [facility] [address] [place], [place] [address]  Resp Panel by RT-PCR (Flu A&B, Covid) Nasopharyngeal Swab     Status: None   Collection Time: 05/27/21 12:25 PM   Specimen: Nasopharyngeal Swab; Nasopharyngeal(NP) swabs in vial transport medium  Result Value Ref Range   SARS Coronavirus 2 by RT PCR NEGATIVE NEGATIVE    Comment: (NOTE) SARS-CoV-2 target nucleic acids are NOT DETECTED.  The SARS-CoV-2 RNA is generally detectable in upper respiratory specimens during the acute phase of infection. The  lowest concentration of SARS-CoV-2 viral copies this assay can detect is 138 copies/mL. A negative result does not preclude SARS-Cov-2 infection and should not be used as the sole basis for treatment or other patient management decisions. A negative result may occur with  improper specimen collection/handling, submission of specimen other than nasopharyngeal swab, presence of viral mutation(s) within the areas targeted by this assay, and inadequate number of viral copies(<138 copies/mL). A negative result must be combined with clinical observations, patient history, and epidemiological information. The expected result is Negative.  Fact Sheet for Patients:  EntrepreneurPulse.com.au  Fact Sheet for Healthcare Providers:  IncredibleEmployment.be  This test is no t yet approved or cleared by the [place] FDA and  has been authorized for detection and/or diagnosis of SARS-CoV-2 by FDA under an Emergency Use Authorization (EUA). This EUA will remain  in effect (meaning this test can be used) for the duration of the COVID-19 declaration under Section 564(b)(1) of the Act, 21 U.S.C.section 360bbb-3(b)(1), unless the authorization is terminated  or revoked sooner.       Influenza A by PCR NEGATIVE NEGATIVE   Influenza B by PCR NEGATIVE NEGATIVE    Comment: (NOTE) The Xpert Xpress SARS-CoV-2/FLU/RSV plus assay is intended as an aid in the diagnosis of influenza from Nasopharyngeal swab specimens and should not be used as a sole basis for treatment. Nasal washings and aspirates are unacceptable for Xpert Xpress SARS-CoV-2/FLU/RSV testing.  Fact Sheet for Patients: EntrepreneurPulse.com.au  Fact Sheet for Healthcare Providers: IncredibleEmployment.be  This test is not yet approved or cleared by the [place] FDA and has been authorized for detection and/or diagnosis of SARS-CoV-2 by FDA under an Emergency  Use Authorization (EUA). This EUA will remain in effect (meaning this test can be used) for the duration of the COVID-19 declaration under Section 564(b)(1) of the Act, 21 U.S.C. section 360bbb-3(b)(1), unless the authorization is terminated or revoked.  Performed at [facility], [place] [address] [place], [place] [address]   HIV Antibody (routine testing w rflx)     Status: None   Collection Time: 05/28/21  4:54 AM  Result Value Ref Range   HIV Screen 4th Generation wRfx Non Reactive Non Reactive    Comment: Performed at [facility]  [facility], [place] [address], [place], [place] [id number]  Basic metabolic panel     Status: Abnormal   Collection Time: 05/28/21  4:54 AM  Result Value Ref Range   Sodium 137 135 - 145 mmol/L   Potassium 4.0 3.5 - 5.1 mmol/L   Chloride 107 98 - 111 mmol/L   CO2 24 22 - 32 mmol/L   Glucose, Bld 85 70 - 99 mg/dL    Comment: Glucose reference range applies only to samples taken after fasting for at least 8 hours.   BUN 10 6 - 20 mg/dL   Creatinine, Ser 0.61 0.44 - 1.00 mg/dL   Calcium 8.4 (L) 8.9 - 10.3 mg/dL   GFR, Estimated >60 >60 mL/min    Comment: (NOTE) Calculated using the CKD-EPI Creatinine Equation (2021)    Anion gap 6 5 - 15    Comment: Performed at [facility] [address] [place], [place] [address]  CBC with Differential/Platelet     Status: Abnormal   Collection Time: 05/28/21  4:54 AM  Result Value Ref Range   WBC 5.1 4.0 - 10.5 K/uL   RBC 3.69 (L) 3.87 - 5.11 MIL/uL   Hemoglobin 9.0 (L) 12.0 - 15.0 g/dL   HCT 29.0 (L) 36.0 - 46.0 %   MCV 78.6 (L) 80.0 - 100.0 fL   MCH 24.4 (L) 26.0 - 34.0 pg   MCHC 31.0 30.0 - 36.0 g/dL   RDW 20.5 (H) 11.5 - 15.5 %   Platelets 336 150 - 400 K/uL   nRBC 0.0 0.0 - 0.2 %   Neutrophils Relative % 60 %   Neutro Abs 3.1 1.7 - 7.7 K/uL   Lymphocytes Relative 33 %   Lymphs Abs 1.7 0.7 - 4.0 K/uL   Monocytes Relative 6 %   Monocytes Absolute 0.3 0.1 - 1.0 K/uL   Eosinophils Relative 1 %    Eosinophils Absolute 0.1 0.0 - 0.5 K/uL   Basophils Relative 0 %   Basophils Absolute 0.0 0.0 - 0.1 K/uL   Immature Granulocytes 0 %   Abs Immature Granulocytes 0.01 0.00 - 0.07 K/uL    Comment: Performed at [facility], [place] [address], [place], [place] [address]  Magnesium     Status: None   Collection Time: 05/28/21  4:54 AM  Result Value Ref Range   Magnesium 1.9 1.7 - 2.4 mg/dL    Comment: Performed at [facility] [address], [place],  [address]    Radiology/Results: CT ABDOMEN PELVIS W CONTRAST  Result Date: 05/27/2021 CLINICAL DATA:  Right-sided abdominal pain and tenderness EXAM: CT ABDOMEN AND PELVIS WITH CONTRAST TECHNIQUE: Multidetector CT imaging of the abdomen and pelvis was performed using the standard protocol following bolus administration of intravenous contrast. CONTRAST:  53m OMNIPAQUE IOHEXOL 350 MG/ML SOLN COMPARISON:  None. FINDINGS: Lower chest: No acute abnormality. Hepatobiliary: No solid liver abnormality is seen. No gallstones, gallbladder wall thickening, or biliary dilatation. Pancreas: Unremarkable. No pancreatic ductal dilatation or surrounding inflammatory changes. Spleen: Normal in size without significant abnormality. Adrenals/Urinary Tract: Adrenal glands are unremarkable. Kidneys are normal, without renal calculi, solid lesion, or hydronephrosis. Bladder is unremarkable. 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 Multiple loops of diffusely fluid distended small bowel in the central abdomen and pelvis, maximum caliber 4.0 cm, with abrupt caliber change in the central right hemiabdomen and complete decompression of the ileum (series 3, image 58). There is gas and stool present in the colon  to the rectum. Appendix appears normal. Vascular/Lymphatic: No significant vascular findings are present. No enlarged abdominal or pelvic lymph nodes. Reproductive: Status post hysterectomy. Other: No abdominal wall hernia or  abnormality. Small volume ascites. Musculoskeletal: No acute or significant osseous findings. IMPRESSION: 1. Multiple loops of diffusely fluid distended small bowel in the central abdomen and pelvis, maximum caliber 4.0 cm, with transition point in the central right hemiabdomen and complete decompression of the ileum. Findings are consistent with partial or developing small bowel obstruction given the presence of gas and stool in the colon. 2. Small volume ascites, likely reactive. 3. 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 4. Status post hysterectomy. Electronically Signed   By: Eddie Candle M.D.   On: 05/27/2021 11:24   DG Abdomen Acute W/Chest  Result Date: 05/27/2021 CLINICAL DATA:  Abdominal pain, evaluate for free air EXAM: DG ABDOMEN ACUTE WITH 1 VIEW CHEST COMPARISON:  None. FINDINGS: Chest: There is no acute cardiopulmonary process. Abdomen: There is mild gaseous distention of multiple loops of bowel in a nonspecific pattern. There are no air-fluid levels. There is no evidence of free intraperitoneal air. Left upper quadrant surgical clips are noted. There is no acute osseous abnormality. IMPRESSION: 1. No evidence of free intraperitoneal air. 2. Mild gaseous distention of bowel in a nonspecific pattern. Electronically Signed   By: Valetta Mole M.D.   On: 05/27/2021 10:07   DG Abd Portable 1V  Result Date: 05/27/2021 CLINICAL DATA:  Status post NG tube placement. EXAM: PORTABLE ABDOMEN - 1 VIEW COMPARISON:  Same day CT abdomen pelvis. FINDINGS: No radiopaque nasogastric tube visualized. Persistent dilation of small bowel measuring up to 4.4 cm. Epigastric surgical clips. No acute osseous abnormality. IMPRESSION: 1. No radiopaque nasogastric tube visualized. Suggest repositioning of nasogastric tube and reimaging to confirm placement. 2. Persistent small-bowel obstruction. Electronically Signed   By: Dahlia Bailiff M.D.   On: 05/27/2021 16:48    Anti-infectives: Anti-infectives (From  admission, onward)    None       Assessment/Plan: Problem List: Patient Active Problem List   Diagnosis Date Noted   SBO (small bowel obstruction) ([place]) 05/27/2021   Small bowel obstruction ([place]) 05/27/2021   Dental caries 03/19/2017   History of prediabetes 10/30/2016   Breast mass, right 10/30/2016   Abnormal weight gain 04/14/2016   PCOS (polycystic ovarian syndrome) 04/14/2016   Thrombocytosis 05/24/2014   Headache 05/22/2014   Iron deficiency anemia 05/15/2014   Other chest pain 05/15/2014   Vitamin D deficiency 05/14/2014   At risk for impaired health maintenance 04/17/2014   B12 deficiency anemia 09/09/2007   Insomnia 08/02/2007    Start bariatric clears.  Hold dilaudid.   * No surgery found *    LOS: 0 days   Matt B. Hassell Done, MD, [facility] [phone] to reach the surgeon on call.    05/28/2021 10:31 AM

## 2021-05-28 NOTE — Progress Notes (Signed)
PROGRESS NOTE  Haley Martinez  [id number] DOB: 17-Sep-1981 DOA: 05/27/2021 PCP: Merryl Hacker, No   Brief Narrative: Haley Martinez is a 40 y.o. female with a history of gastric bypass 2009 who cam to the ED 9/2 after being awoken out of sleep with severe abdominal pain with associated nausea and vomiting. She was afebrile without leukocytosis and abdominal CT revealing loops of fluid-distended small bowel up to 4cm and transition point in central right hemiabdomen with distal decompression. Surgery was consulted given her history and patient was admitted.  Assessment & Plan: Principal Problem:   SBO (small bowel obstruction) (HCC) Active Problems:   Iron deficiency anemia   Thrombocytosis   Small bowel obstruction (HCC)  Small bowel obstruction: With clear transition point, at high risk for failure of conservative management based on history of bariatric surgery and hysterectomy. She's had stool output, so complete obstruction currently is unlikely, but exam remains concerning and her per oral intake is insufficient to date to send her home.  - Will remain on IV fluids.  - Continue IV morphine (itching w/dilaudid, has tolerated morphine in the past). Encouraged to minimize use to avoid opioid-induced constipation.  - IV zofran prn - Will remain on fluids today and follow abdominal exam in AM  Iron deficiency anemia:  - Check anemia panel in AM given risk of impaired absorption and microcytosis.   History of prediabetes: No indication that this remains an issue now that she's lost so much weight.  DVT prophylaxis: SCDs Code Status: Full Family Communication: None at bedside Disposition Plan:  Status is: Observation, will switch to inpatient as the patient requires an additional midnight of inpatient services including IV analgesics and IV fluids  Remains inpatient appropriate because:IV treatments appropriate due to intensity of illness or inability to take PO  Dispo: The patient is from:  Home              Anticipated d/c is to: Home              Patient currently is not medically stable to d/c.   Consultants:  General surgery  Procedures:  None  Antimicrobials: None   Subjective: Pain is constant, 7/10 (improved from 20/10 reported in ED), associated with minimal nausea, no vomiting. She does not report bleeding.   Objective: BP 114/83 (BP Location: Right Arm)   Pulse (!) 48   Temp 98.4 F (36.9 C) (Oral)   Resp 16   Ht 5' 5.5" (1.664 m)   Wt 74.8 kg   LMP 12/18/2016 (Exact Date)   SpO2 100%   BMI 27.02 kg/m   Gen: 40 y.o. female in no distress  Pulm: Non-labored breathing room air. Clear to auscultation bilaterally.  CV: Regular rate and rhythm. No murmur, rub, or gallop. No JVD, no pedal edema. GI: Abdomen soft, tender diffusely without guarding or rebound, non-distended, hypoactive bowel sounds. No organomegaly or masses felt. Ext: Warm, no deformities Skin: No rashes, lesions or ulcers. Postsurgical scars noted on abdomen.  Neuro: Alert and oriented. No focal neurological deficits. Psych: Judgement and insight appear normal. Mood & affect appropriate.   Data Reviewed: I have personally reviewed following labs and imaging studies  CBC: Recent Labs  Lab 05/27/21 0855 05/28/21 0454  WBC 9.6 5.1  NEUTROABS 8.3* 3.1  HGB 10.4* 9.0*  HCT 34.3* 29.0*  MCV 79.2* 78.6*  PLT 441* [id number]   Basic Metabolic Panel: Recent Labs  Lab 05/27/21 0855 05/28/21 0454  NA 135 137  K 4.2  4.0  CL 107 107  CO2 19* 24  GLUCOSE 103* 85  BUN 6 10  CREATININE 0.70 0.61  CALCIUM 8.8* 8.4*  MG 2.1 1.9   GFR: Estimated Creatinine Clearance: 94.7 mL/min (by C-G formula based on SCr of 0.61 mg/dL). Liver Function Tests: Recent Labs  Lab 05/27/21 0855  AST 36  ALT 21  ALKPHOS 64  BILITOT 0.6  PROT 7.0  ALBUMIN 3.9   Recent Labs  Lab 05/27/21 0855  LIPASE 21   No results for input(s): AMMONIA in the last 168 hours. Coagulation Profile: No results for  input(s): INR, PROTIME in the last 168 hours. Cardiac Enzymes: No results for input(s): CKTOTAL, CKMB, CKMBINDEX, TROPONINI in the last 168 hours. BNP (last 3 results) No results for input(s): PROBNP in the last 8760 hours. HbA1C: No results for input(s): HGBA1C in the last 72 hours. CBG: No results for input(s): GLUCAP in the last 168 hours. Lipid Profile: No results for input(s): CHOL, HDL, LDLCALC, TRIG, CHOLHDL, LDLDIRECT in the last 72 hours. Thyroid Function Tests: No results for input(s): TSH, T4TOTAL, FREET4, T3FREE, THYROIDAB in the last 72 hours. Anemia Panel: No results for input(s): VITAMINB12, FOLATE, FERRITIN, TIBC, IRON, RETICCTPCT in the last 72 hours. Urine analysis:    Component Value Date/Time   COLORURINE YELLOW 05/28/2014 0355   APPEARANCEUR CLEAR 05/28/2014 0355   LABSPEC 1.020 03/27/2017 1721   LABSPEC 1.015 05/15/2014 1311   PHURINE 7.0 03/27/2017 1721   GLUCOSEU NEGATIVE 03/27/2017 1721   GLUCOSEU Negative 05/15/2014 1311   HGBUR NEGATIVE 03/27/2017 1721   HGBUR negative 10/25/2007 1409   BILIRUBINUR NEGATIVE 03/27/2017 1721   BILIRUBINUR Negative 05/15/2014 1311   KETONESUR NEGATIVE 03/27/2017 1721   PROTEINUR NEGATIVE 03/27/2017 1721   UROBILINOGEN 4.0 (H) 03/27/2017 1721   UROBILINOGEN 0.2 05/15/2014 1311   NITRITE NEGATIVE 03/27/2017 1721   LEUKOCYTESUR SMALL (A) 03/27/2017 1721   LEUKOCYTESUR Negative 05/15/2014 1311   Recent Results (from the past 240 hour(s))  Resp Panel by RT-PCR (Flu A&B, Covid) Nasopharyngeal Swab     Status: None   Collection Time: 05/27/21 12:25 PM   Specimen: Nasopharyngeal Swab; Nasopharyngeal(NP) swabs in vial transport medium  Result Value Ref Range Status   SARS Coronavirus 2 by RT PCR NEGATIVE NEGATIVE Final    Comment: (NOTE) SARS-CoV-2 target nucleic acids are NOT DETECTED.  The SARS-CoV-2 RNA is generally detectable in upper respiratory specimens during the acute phase of infection. The lowest concentration  of SARS-CoV-2 viral copies this assay can detect is 138 copies/mL. A negative result does not preclude SARS-Cov-2 infection and should not be used as the sole basis for treatment or other patient management decisions. A negative result may occur with  improper specimen collection/handling, submission of specimen other than nasopharyngeal swab, presence of viral mutation(s) within the areas targeted by this assay, and inadequate number of viral copies(<138 copies/mL). A negative result must be combined with clinical observations, patient history, and epidemiological information. The expected result is Negative.  Fact Sheet for Patients:  EntrepreneurPulse.com.au  Fact Sheet for Healthcare Providers:  IncredibleEmployment.be  This test is no t yet approved or cleared by the [place] FDA and  has been authorized for detection and/or diagnosis of SARS-CoV-2 by FDA under an Emergency Use Authorization (EUA). This EUA will remain  in effect (meaning this test can be used) for the duration of the COVID-19 declaration under Section 564(b)(1) of the Act, 21 U.S.C.section 360bbb-3(b)(1), unless the authorization is terminated  or revoked sooner.  Influenza A by PCR NEGATIVE NEGATIVE Final   Influenza B by PCR NEGATIVE NEGATIVE Final    Comment: (NOTE) The Xpert Xpress SARS-CoV-2/FLU/RSV plus assay is intended as an aid in the diagnosis of influenza from Nasopharyngeal swab specimens and should not be used as a sole basis for treatment. Nasal washings and aspirates are unacceptable for Xpert Xpress SARS-CoV-2/FLU/RSV testing.  Fact Sheet for Patients: EntrepreneurPulse.com.au  Fact Sheet for Healthcare Providers: IncredibleEmployment.be  This test is not yet approved or cleared by the [place] FDA and has been authorized for detection and/or diagnosis of SARS-CoV-2 by FDA under an Emergency Use  Authorization (EUA). This EUA will remain in effect (meaning this test can be used) for the duration of the COVID-19 declaration under Section 564(b)(1) of the Act, 21 U.S.C. section 360bbb-3(b)(1), unless the authorization is terminated or revoked.  Performed at [facility], [place] [address], [place], [place] [address]       Radiology Studies: CT ABDOMEN PELVIS W CONTRAST  Result Date: 05/27/2021 CLINICAL DATA:  Right-sided abdominal pain and tenderness EXAM: CT ABDOMEN AND PELVIS WITH CONTRAST TECHNIQUE: Multidetector CT imaging of the abdomen and pelvis was performed using the standard protocol following bolus administration of intravenous contrast. CONTRAST:  58m OMNIPAQUE IOHEXOL 350 MG/ML SOLN COMPARISON:  None. FINDINGS: Lower chest: No acute abnormality. Hepatobiliary: No solid liver abnormality is seen. No gallstones, gallbladder wall thickening, or biliary dilatation. Pancreas: Unremarkable. No pancreatic ductal dilatation or surrounding inflammatory changes. Spleen: Normal in size without significant abnormality. Adrenals/Urinary Tract: Adrenal glands are unremarkable. Kidneys are normal, without renal calculi, solid lesion, or hydronephrosis. Bladder is unremarkable. 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 Multiple loops of diffusely fluid distended small bowel in the central abdomen and pelvis, maximum caliber 4.0 cm, with abrupt caliber change in the central right hemiabdomen and complete decompression of the ileum (series 3, image 58). There is gas and stool present in the colon to the rectum. Appendix appears normal. Vascular/Lymphatic: No significant vascular findings are present. No enlarged abdominal or pelvic lymph nodes. Reproductive: Status post hysterectomy. Other: No abdominal wall hernia or abnormality. Small volume ascites. Musculoskeletal: No acute or significant osseous findings. IMPRESSION: 1. Multiple loops of diffusely fluid distended small  bowel in the central abdomen and pelvis, maximum caliber 4.0 cm, with transition point in the central right hemiabdomen and complete decompression of the ileum. Findings are consistent with partial or developing small bowel obstruction given the presence of gas and stool in the colon. 2. Small volume ascites, likely reactive. 3. 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 4. Status post hysterectomy. Electronically Signed   By: AEddie CandleM.D.   On: 05/27/2021 11:24   DG Abdomen Acute W/Chest  Result Date: 05/27/2021 CLINICAL DATA:  Abdominal pain, evaluate for free air EXAM: DG ABDOMEN ACUTE WITH 1 VIEW CHEST COMPARISON:  None. FINDINGS: Chest: There is no acute cardiopulmonary process. Abdomen: There is mild gaseous distention of multiple loops of bowel in a nonspecific pattern. There are no air-fluid levels. There is no evidence of free intraperitoneal air. Left upper quadrant surgical clips are noted. There is no acute osseous abnormality. IMPRESSION: 1. No evidence of free intraperitoneal air. 2. Mild gaseous distention of bowel in a nonspecific pattern. Electronically Signed   By: PValetta MoleM.D.   On: 05/27/2021 10:07   DG Abd Portable 1V  Result Date: 05/27/2021 CLINICAL DATA:  Status post NG tube placement. EXAM: PORTABLE ABDOMEN - 1 VIEW COMPARISON:  Same  day CT abdomen pelvis. FINDINGS: No radiopaque nasogastric tube visualized. Persistent dilation of small bowel measuring up to 4.4 cm. Epigastric surgical clips. No acute osseous abnormality. IMPRESSION: 1. No radiopaque nasogastric tube visualized. Suggest repositioning of nasogastric tube and reimaging to confirm placement. 2. Persistent small-bowel obstruction. Electronically Signed   By: Dahlia Bailiff M.D.   On: 05/27/2021 16:48    Scheduled Meds: Continuous Infusions:  sodium chloride 75 mL/hr at 05/28/21 1253     LOS: 0 days   Time spent: 25 minutes.  Patrecia Pour, MD [facility] [email or web address] 05/28/2021, 1:13  PM

## 2021-05-29 LAB — CBC
HCT: 29 % — ABNORMAL LOW (ref 36.0–46.0)
Hemoglobin: 8.6 g/dL — ABNORMAL LOW (ref 12.0–15.0)
MCH: 23.6 pg — ABNORMAL LOW (ref 26.0–34.0)
MCHC: 29.7 g/dL — ABNORMAL LOW (ref 30.0–36.0)
MCV: 79.5 fL — ABNORMAL LOW (ref 80.0–100.0)
Platelets: 331 10*3/uL (ref 150–400)
RBC: 3.65 MIL/uL — ABNORMAL LOW (ref 3.87–5.11)
RDW: 20.8 % — ABNORMAL HIGH (ref 11.5–15.5)
WBC: 4.1 10*3/uL (ref 4.0–10.5)
nRBC: 0 % (ref 0.0–0.2)

## 2021-05-29 LAB — BASIC METABOLIC PANEL
Anion gap: 5 (ref 5–15)
BUN: 6 mg/dL (ref 6–20)
CO2: 25 mmol/L (ref 22–32)
Calcium: 8.3 mg/dL — ABNORMAL LOW (ref 8.9–10.3)
Chloride: 107 mmol/L (ref 98–111)
Creatinine, Ser: 0.58 mg/dL (ref 0.44–1.00)
GFR, Estimated: >60 mL/min (ref >60)
Glucose, Bld: 91 mg/dL (ref 70–99)
Potassium: 4.1 mmol/L (ref 3.5–5.1)
Sodium: 137 mmol/L (ref 135–145)

## 2021-05-29 LAB — FERRITIN: Ferritin: 6 ng/mL — ABNORMAL LOW (ref 11–307)

## 2021-05-29 LAB — VITAMIN B12: Vitamin B-12: 68 pg/mL — ABNORMAL LOW (ref 180–914)

## 2021-05-29 LAB — FOLATE: Folate: 11.2 ng/mL (ref >5.9)

## 2021-05-29 LAB — IRON AND TIBC
Iron: 11 ug/dL — ABNORMAL LOW (ref 28–170)
Saturation Ratios: 3 % — ABNORMAL LOW (ref 10.4–31.8)
TIBC: 349 ug/dL (ref 250–450)
UIBC: 338 ug/dL

## 2021-05-29 MED ORDER — CYANOCOBALAMIN 1000 MCG/ML IJ SOLN
1000.0000 ug | Freq: Once | INTRAMUSCULAR | Status: AC
  Administered 2021-05-29: 1000 ug via INTRAMUSCULAR
  Filled 2021-05-29: qty 1

## 2021-05-29 MED ORDER — VITAMIN B-12 1000 MCG PO TABS: 1000.0000 ug | ORAL_TABLET | Freq: Every day | ORAL | 0 refills | Status: AC

## 2021-05-29 MED ORDER — FERROUS SULFATE 300 (60 FE) MG/5ML PO SYRP: 300.0000 mg | ORAL_SOLUTION | Freq: Two times a day (BID) | ORAL | 0 refills | Status: AC

## 2021-05-29 MED ORDER — ONDANSETRON HCL 4 MG PO TABS: 4.0000 mg | ORAL_TABLET | Freq: Four times a day (QID) | ORAL | 0 refills | Status: AC

## 2021-05-29 NOTE — Progress Notes (Signed)
Subjective: No complaints today.  Tolerating CLD.   No pain.  + BMs  ROS: See above, otherwise other systems negative  Objective: Vital signs in last 24 hours: Temp:  [97.8 F (36.6 C)-98.6 F (37 C)] 98 F (36.7 C) (09/04 0427) Pulse Rate:  [48-71] 71 (09/04 0427) Resp:  [14-17] 17 (09/04 0427) BP: (111-124)/(70-87) 113/78 (09/04 0427) SpO2:  [100 %] 100 % (09/04 0427) Last BM Date: 05/27/21  Intake/Output from previous day: 09/03 0701 - 09/04 0700 In: 1480 [P.O.:480; I.V.:1000] Out: -  Intake/Output this shift: Total I/O In: 1312.4 [P.O.:240; I.V.:1072.4] Out: -   PE: Abd: soft, NT, Nd, +BS  Lab Results:  Recent Labs    05/28/21 0454 05/29/21 0031  WBC 5.1 4.1  HGB 9.0* 8.6*  HCT 29.0* 29.0*  PLT 336 331   BMET Recent Labs    05/28/21 0454 05/29/21 0031  NA 137 137  K 4.0 4.1  CL 107 107  CO2 24 25  GLUCOSE 85 91  BUN 10 6  CREATININE 0.61 0.58  CALCIUM 8.4* 8.3*   PT/INR No results for input(s): LABPROT, INR in the last 72 hours. CMP     Component Value Date/Time   NA 137 05/29/2021 0031   NA 136 05/15/2014 1310   K 4.1 05/29/2021 0031   K 3.9 05/15/2014 1310   CL 107 05/29/2021 0031   CO2 25 05/29/2021 0031   CO2 24 05/15/2014 1310   GLUCOSE 91 05/29/2021 0031   GLUCOSE 178 (H) 05/15/2014 1310   BUN 6 05/29/2021 0031   BUN 5.8 (L) 05/15/2014 1310   CREATININE 0.58 05/29/2021 0031   CREATININE 0.8 05/15/2014 1310   CALCIUM 8.3 (L) 05/29/2021 0031   CALCIUM 9.0 05/15/2014 1310   PROT 7.0 05/27/2021 0855   PROT 7.0 05/15/2014 1310   ALBUMIN 3.9 05/27/2021 0855   ALBUMIN 3.9 05/15/2014 1310   AST 36 05/27/2021 0855   AST 38 (H) 05/15/2014 1310   ALT 21 05/27/2021 0855   ALT 31 05/15/2014 1310   ALKPHOS 64 05/27/2021 0855   ALKPHOS 67 05/15/2014 1310   BILITOT 0.6 05/27/2021 0855   BILITOT 1.10 05/15/2014 1310   GFRNONAA >60 05/29/2021 0031   GFRNONAA >89 05/12/2014 1601   GFRAA >60 01/05/2017 0931   GFRAA >89 05/12/2014  1601   Lipase     Component Value Date/Time   LIPASE 21 05/27/2021 0855       Studies/Results: CT ABDOMEN PELVIS W CONTRAST  Result Date: 05/27/2021 CLINICAL DATA:  Right-sided abdominal pain and tenderness EXAM: CT ABDOMEN AND PELVIS WITH CONTRAST TECHNIQUE: Multidetector CT imaging of the abdomen and pelvis was performed using the standard protocol following bolus administration of intravenous contrast. CONTRAST:  31m OMNIPAQUE IOHEXOL 350 MG/ML SOLN COMPARISON:  None. FINDINGS: Lower chest: No acute abnormality. Hepatobiliary: No solid liver abnormality is seen. No gallstones, gallbladder wall thickening, or biliary dilatation. Pancreas: Unremarkable. No pancreatic ductal dilatation or surrounding inflammatory changes. Spleen: Normal in size without significant abnormality. Adrenals/Urinary Tract: Adrenal glands are unremarkable. Kidneys are normal, without renal calculi, solid lesion, or hydronephrosis. Bladder is unremarkable. 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 Multiple loops of diffusely fluid distended small bowel in the central abdomen and pelvis, maximum caliber 4.0 cm, with abrupt caliber change in the central right hemiabdomen and complete decompression of the ileum (series 3, image 58). There is gas and stool present in the colon to the rectum. Appendix appears normal. Vascular/Lymphatic: No  significant vascular findings are present. No enlarged abdominal or pelvic lymph nodes. Reproductive: Status post hysterectomy. Other: No abdominal wall hernia or abnormality. Small volume ascites. Musculoskeletal: No acute or significant osseous findings. IMPRESSION: 1. Multiple loops of diffusely fluid distended small bowel in the central abdomen and pelvis, maximum caliber 4.0 cm, with transition point in the central right hemiabdomen and complete decompression of the ileum. Findings are consistent with partial or developing small bowel obstruction given the presence  of gas and stool in the colon. 2. Small volume ascites, likely reactive. 3. 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 4. Status post hysterectomy. Electronically Signed   By: Eddie Candle M.D.   On: 05/27/2021 11:24   DG Abdomen Acute W/Chest  Result Date: 05/27/2021 CLINICAL DATA:  Abdominal pain, evaluate for free air EXAM: DG ABDOMEN ACUTE WITH 1 VIEW CHEST COMPARISON:  None. FINDINGS: Chest: There is no acute cardiopulmonary process. Abdomen: There is mild gaseous distention of multiple loops of bowel in a nonspecific pattern. There are no air-fluid levels. There is no evidence of free intraperitoneal air. Left upper quadrant surgical clips are noted. There is no acute osseous abnormality. IMPRESSION: 1. No evidence of free intraperitoneal air. 2. Mild gaseous distention of bowel in a nonspecific pattern. Electronically Signed   By: Valetta Mole M.D.   On: 05/27/2021 10:07   DG Abd Portable 1V  Result Date: 05/27/2021 CLINICAL DATA:  Status post NG tube placement. EXAM: PORTABLE ABDOMEN - 1 VIEW COMPARISON:  Same day CT abdomen pelvis. FINDINGS: No radiopaque nasogastric tube visualized. Persistent dilation of small bowel measuring up to 4.4 cm. Epigastric surgical clips. No acute osseous abnormality. IMPRESSION: 1. No radiopaque nasogastric tube visualized. Suggest repositioning of nasogastric tube and reimaging to confirm placement. 2. Persistent small-bowel obstruction. Electronically Signed   By: Dahlia Bailiff M.D.   On: 05/27/2021 16:48    Anti-infectives: Anti-infectives (From admission, onward)    None        Assessment/Plan SBO s/p gastric bypass -tolerating CLD -adv to [place] and maintain for about a week or so and then can adv to solid bari diet at home -she is going home today -will give out info so she can follow up in this city as needed  FEN - bari FLD VTE - DC home ID - none   LOS: 1 day    Henreitta Cea , [facility] 05/29/2021, 9:18  AM Please see Amion for pager number during day hours 7:00am-4:30pm or 7:00am -11:30am on weekends

## 2021-05-29 NOTE — Discharge Summary (Signed)
Physician Discharge Summary  Haley Martinez [id number] DOB: 08/20/1981 DOA: 05/27/2021  PCP: Pcp, No  Admit date: 05/27/2021 Discharge date: 05/29/2021  Admitted From: Home Disposition: Home   Recommendations for Outpatient Follow-up:  Follow up with PCP and/or hematology in 1-2 weeks Started iron syrup in lieu of tablets per pt preference. Pt declined IV iron during hospitalization. Started B12 supplements after receiving 1057mg IM during hospitalization.   Home Health: None Equipment/Devices: None Discharge Condition: Stable CODE STATUS: Full Diet recommendation: As tolerated  Brief/Interim Summary: Haley FARLESSis a 40y.o. female with a history of gastric bypass 2009 who cam to the ED 9/2 after being awoken out of sleep with severe abdominal pain with associated nausea and vomiting. She was afebrile without leukocytosis and abdominal CT revealing loops of fluid-distended small bowel up to 4cm and transition point in central right hemiabdomen with distal decompression. Surgery was consulted given her history and patient was admitted. She has improved with conservative measures and is stable for discharge. Please see details below.  Discharge Diagnoses:  Principal Problem:   SBO (small bowel obstruction) (HCC) Active Problems:   Iron deficiency anemia   Thrombocytosis   Small bowel obstruction (HCC)  Partial SBO, hx bypass: With clear transition point, at high risk for failure of conservative management based on history of bariatric surgery and hysterectomy. She's had stool output, much improved pain and benign exam on day of discharge. Requesting discharge.  - Return precautions reviewed - Rx zofran prn.  - Tylenol prn recommended, pain is much improved. Urged to avoid NSAIDs and EtOH.   Vitamin B12 deficiency: - Urged to consider parenteral administration on ongoing basis due to concern for bypass-induced impairment in absorption.  - IM x1 given here, 10092m daily  supplement prescribed. Warned of risk of neuropathy if untreated.  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 Also has associated thrombocytosis due to this. Had hysterectomy to avoid blood loss anemia in this setting. - Declined IV iron for now, will consider at follow up. Started BID syrup supplement.  Discharge Instructions Discharge Instructions     Discharge instructions   Complete by: As directed    You were admitted for a partial small bowel obstruction which has improved. You are stable for discharge with plans to slowly advance your diet, take zofran for nausea if needed, and start taking iron syrup twice daily and high dose b12 supplement daily. Please follow up with your hematologist to discuss other options to help with deficiencies and/or seek medical attention right away if your pain worsens or you become unable to tolerate fluids by mouth.      Allergies as of 05/29/2021       Reactions   Trazodone Hcl    Other reaction(s): Other, Other (See Comments) "severe headache" "severe headache"   Nsaids    Hx of gastric bypass   Trazodone And Nefazodone    "severe headache"        Medication List     STOP taking these medications    ferrous gluconate 324 MG tablet Commonly known as: FERGON       TAKE these medications    acetaminophen 500 MG tablet Commonly known as: TYLENOL Take 1,500 mg by mouth every 6 (six) hours as needed for mild pain or headache.   DETOX ENZYME FORMULA PO Take 1 tablet by mouth daily.   ferrous sulfate 300 (60 Fe) MG/5ML syrup Take 5 mLs (300 mg total) by mouth 2 (  two) times daily with a meal.   GOODY HEADACHE PO Take 1 packet by mouth 2 (two) times daily as needed (headache).   multivitamin with minerals tablet Take 1 tablet by mouth daily.   ondansetron 4 MG tablet Commonly known as: ZOFRAN Take 1 tablet (4 mg total) by mouth every 6 (six) hours.   vitamin B-12 1000 MCG tablet Commonly  known as: CYANOCOBALAMIN Take 1 tablet (1,000 mcg total) by mouth daily.        Follow-up Information     Haley Lark, MD Follow up.   Specialty: Hematology and Oncology Contact information: [place] [address] [phone]                Allergies  Allergen Reactions   Trazodone Hcl     Other reaction(s): Other, Other (See Comments) "severe headache" "severe headache"    Nsaids     Hx of gastric bypass   Trazodone And Nefazodone     "severe headache"    Consultations: General surgery  Procedures/Studies: CT ABDOMEN PELVIS W CONTRAST  Result Date: 05/27/2021 CLINICAL DATA:  Right-sided abdominal pain and tenderness EXAM: CT ABDOMEN AND PELVIS WITH CONTRAST TECHNIQUE: Multidetector CT imaging of the abdomen and pelvis was performed using the standard protocol following bolus administration of intravenous contrast. CONTRAST:  19m OMNIPAQUE IOHEXOL 350 MG/ML SOLN COMPARISON:  None. FINDINGS: Lower chest: No acute abnormality. Hepatobiliary: No solid liver abnormality is seen. No gallstones, gallbladder wall thickening, or biliary dilatation. Pancreas: Unremarkable. No pancreatic ductal dilatation or surrounding inflammatory changes. Spleen: Normal in size without significant abnormality. Adrenals/Urinary Tract: Adrenal glands are unremarkable. Kidneys are normal, without renal calculi, solid lesion, or hydronephrosis. Bladder is unremarkable. 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 Multiple loops of diffusely fluid distended small bowel in the central abdomen and pelvis, maximum caliber 4.0 cm, with abrupt caliber change in the central right hemiabdomen and complete decompression of the ileum (series 3, image 58). There is gas and stool present in the colon to the rectum. Appendix appears normal. Vascular/Lymphatic: No significant vascular findings are present. No enlarged abdominal or pelvic lymph nodes. Reproductive:  Status post hysterectomy. Other: No abdominal wall hernia or abnormality. Small volume ascites. Musculoskeletal: No acute or significant osseous findings. IMPRESSION: 1. Multiple loops of diffusely fluid distended small bowel in the central abdomen and pelvis, maximum caliber 4.0 cm, with transition point in the central right hemiabdomen and complete decompression of the ileum. Findings are consistent with partial or developing small bowel obstruction given the presence of gas and stool in the colon. 2. Small volume ascites, likely reactive. 3. 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 4. Status post hysterectomy. Electronically Signed   By: AEddie CandleM.D.   On: 05/27/2021 11:24   DG Abdomen Acute W/Chest  Result Date: 05/27/2021 CLINICAL DATA:  Abdominal pain, evaluate for free air EXAM: DG ABDOMEN ACUTE WITH 1 VIEW CHEST COMPARISON:  None. FINDINGS: Chest: There is no acute cardiopulmonary process. Abdomen: There is mild gaseous distention of multiple loops of bowel in a nonspecific pattern. There are no air-fluid levels. There is no evidence of free intraperitoneal air. Left upper quadrant surgical clips are noted. There is no acute osseous abnormality. IMPRESSION: 1. No evidence of free intraperitoneal air. 2. Mild gaseous distention of bowel in a nonspecific pattern. Electronically Signed   By: PValetta MoleM.D.   On: 05/27/2021 10:07   DG Abd Portable 1V  Result Date: 05/27/2021 CLINICAL DATA:  Status  post NG tube placement. EXAM: PORTABLE ABDOMEN - 1 VIEW COMPARISON:  Same day CT abdomen pelvis. FINDINGS: No radiopaque nasogastric tube visualized. Persistent dilation of small bowel measuring up to 4.4 cm. Epigastric surgical clips. No acute osseous abnormality. IMPRESSION: 1. No radiopaque nasogastric tube visualized. Suggest repositioning of nasogastric tube and reimaging to confirm placement. 2. Persistent small-bowel obstruction. Electronically Signed   By: Dahlia Bailiff M.D.   On:  05/27/2021 16:48     Subjective: Has no pain currently, nausea controlled with zofran, tolerating some po intake. BM overnight last night, +flatus today.   Discharge Exam: Vitals:   05/28/21 2259 05/29/21 0427  BP: 124/87 113/78  Pulse: 68 71  Resp: 14 17  Temp: 97.8 F (36.6 C) 98 F (36.7 C)  SpO2: 100% 100%   General: Pt is alert, awake, not in acute distress Cardiovascular: RRR, S1/S2 +, no rubs, no gallops Respiratory: CTA bilaterally, no wheezing, no rhonchi Abdominal: Soft, not tender nor distended.  Extremities: No edema, no cyanosis  Labs: BNP (last 3 results) No results for input(s): BNP in the last 8760 hours. Basic Metabolic Panel: Recent Labs  Lab 05/27/21 0855 05/28/21 0454 05/29/21 0031  NA 135 137 137  K 4.2 4.0 4.1  CL 107 107 107  CO2 19* 24 25  GLUCOSE 103* 85 91  BUN '6 10 6  '$ CREATININE 0.70 0.61 0.58  CALCIUM 8.8* 8.4* 8.3*  MG 2.1 1.9  --    Liver Function Tests: Recent Labs  Lab 05/27/21 0855  AST 36  ALT 21  ALKPHOS 64  BILITOT 0.6  PROT 7.0  ALBUMIN 3.9   Recent Labs  Lab 05/27/21 0855  LIPASE 21   No results for input(s): AMMONIA in the last 168 hours. CBC: Recent Labs  Lab 05/27/21 0855 05/28/21 0454 05/29/21 0031  WBC 9.6 5.1 4.1  NEUTROABS 8.3* 3.1  --   HGB 10.4* 9.0* 8.6*  HCT 34.3* 29.0* 29.0*  MCV 79.2* 78.6* 79.5*  PLT 441* 336 331   Cardiac Enzymes: No results for input(s): CKTOTAL, CKMB, CKMBINDEX, TROPONINI in the last 168 hours. BNP: Invalid input(s): POCBNP CBG: No results for input(s): GLUCAP in the last 168 hours. D-Dimer No results for input(s): DDIMER in the last 72 hours. Hgb A1c No results for input(s): HGBA1C in the last 72 hours. Lipid Profile No results for input(s): CHOL, HDL, LDLCALC, TRIG, CHOLHDL, LDLDIRECT in the last 72 hours. Thyroid function studies No results for input(s): TSH, T4TOTAL, T3FREE, THYROIDAB in the last 72 hours.  Invalid input(s): FREET3 Anemia work up Recent  Labs    05/29/21 0031  VITAMINB12 68*  FOLATE 11.2  FERRITIN 6*  TIBC 349  IRON 11*   Urinalysis    Component Value Date/Time   COLORURINE YELLOW 05/28/2014 0355   APPEARANCEUR CLEAR 05/28/2014 0355   LABSPEC 1.020 03/27/2017 1721   LABSPEC 1.015 05/15/2014 1311   PHURINE 7.0 03/27/2017 1721   GLUCOSEU NEGATIVE 03/27/2017 1721   GLUCOSEU Negative 05/15/2014 1311   HGBUR NEGATIVE 03/27/2017 1721   HGBUR negative 10/25/2007 1409   BILIRUBINUR NEGATIVE 03/27/2017 1721   BILIRUBINUR Negative 05/15/2014 1311   KETONESUR NEGATIVE 03/27/2017 1721   PROTEINUR NEGATIVE 03/27/2017 1721   UROBILINOGEN 4.0 (H) 03/27/2017 1721   UROBILINOGEN 0.2 05/15/2014 1311   NITRITE NEGATIVE 03/27/2017 1721   LEUKOCYTESUR SMALL (A) 03/27/2017 1721   LEUKOCYTESUR Negative 05/15/2014 1311    Microbiology Recent Results (from the past 240 hour(s))  Resp Panel by RT-PCR (Flu A&B,  Covid) Nasopharyngeal Swab     Status: None   Collection Time: 05/27/21 12:25 PM   Specimen: Nasopharyngeal Swab; Nasopharyngeal(NP) swabs in vial transport medium  Result Value Ref Range Status   SARS Coronavirus 2 by RT PCR NEGATIVE NEGATIVE Final    Comment: (NOTE) SARS-CoV-2 target nucleic acids are NOT DETECTED.  The SARS-CoV-2 RNA is generally detectable in upper respiratory specimens during the acute phase of infection. The lowest concentration of SARS-CoV-2 viral copies this assay can detect is 138 copies/mL. A negative result does not preclude SARS-Cov-2 infection and should not be used as the sole basis for treatment or other patient management decisions. A negative result may occur with  improper specimen collection/handling, submission of specimen other than nasopharyngeal swab, presence of viral mutation(s) within the areas targeted by this assay, and inadequate number of viral copies(<138 copies/mL). A negative result must be combined with clinical observations, patient history, and  epidemiological information. The expected result is Negative.  Fact Sheet for Patients:  EntrepreneurPulse.com.au  Fact Sheet for Healthcare Providers:  IncredibleEmployment.be  This test is no t yet approved or cleared by the [place] FDA and  has been authorized for detection and/or diagnosis of SARS-CoV-2 by FDA under an Emergency Use Authorization (EUA). This EUA will remain  in effect (meaning this test can be used) for the duration of the COVID-19 declaration under Section 564(b)(1) of the Act, 21 U.S.C.section 360bbb-3(b)(1), unless the authorization is terminated  or revoked sooner.       Influenza A by PCR NEGATIVE NEGATIVE Final   Influenza B by PCR NEGATIVE NEGATIVE Final    Comment: (NOTE) The Xpert Xpress SARS-CoV-2/FLU/RSV plus assay is intended as an aid in the diagnosis of influenza from Nasopharyngeal swab specimens and should not be used as a sole basis for treatment. Nasal washings and aspirates are unacceptable for Xpert Xpress SARS-CoV-2/FLU/RSV testing.  Fact Sheet for Patients: EntrepreneurPulse.com.au  Fact Sheet for Healthcare Providers: IncredibleEmployment.be  This test is not yet approved or cleared by the [place] FDA and has been authorized for detection and/or diagnosis of SARS-CoV-2 by FDA under an Emergency Use Authorization (EUA). This EUA will remain in effect (meaning this test can be used) for the duration of the COVID-19 declaration under Section 564(b)(1) of the Act, 21 U.S.C. section 360bbb-3(b)(1), unless the authorization is terminated or revoked.  Performed at [facility], [place] [address] [place], [place] [address]     Time coordinating discharge: Approximately 40 minutes  Patrecia Pour, MD  [facility] 05/29/2021, 8:55 AM

## 2021-05-29 NOTE — Plan of Care (Signed)
  Problem: Education: Goal: Knowledge of General Education information will improve Description: Including pain rating scale, medication(s)/side effects and non-pharmacologic comfort measures Outcome: Adequate for Discharge   

## 2022-07-11 IMAGING — CT CT ABD-PELV W/ CM
2 of 5 series · 16 of 46 positions shown, 18 images · IV contrast (omnipaque)
Comparison: None.

CLINICAL DATA: Right-sided abdominal pain and tenderness

EXAM:
CT ABDOMEN AND PELVIS WITH CONTRAST
TECHNIQUE: Multidetector CT imaging of the abdomen and pelvis was performed
using the standard protocol following bolus administration of
intravenous contrast.
CONTRAST:  80mL OMNIPAQUE IOHEXOL 350 MG/ML SOLN

[Series 3: a/p w/ 5mm · axial · 0.77mm/px · z∈[+805,+1235]mm · 13 of 96 slices shown, 15 images]
[im 5/96  soft-tissue]
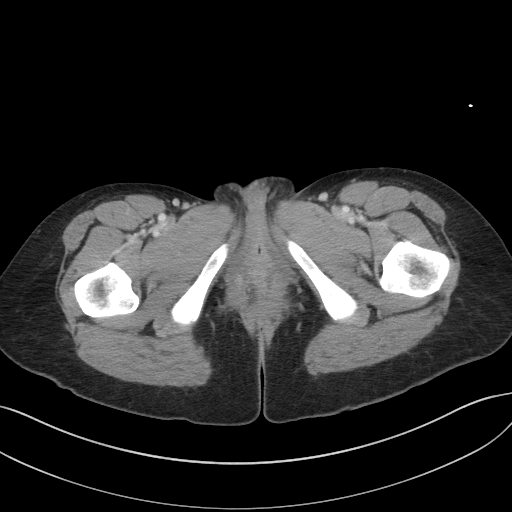
[im 5/96  bone]
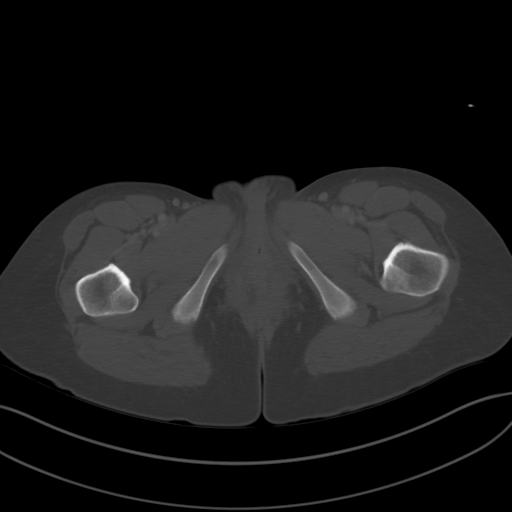
[im 15/96  soft-tissue]
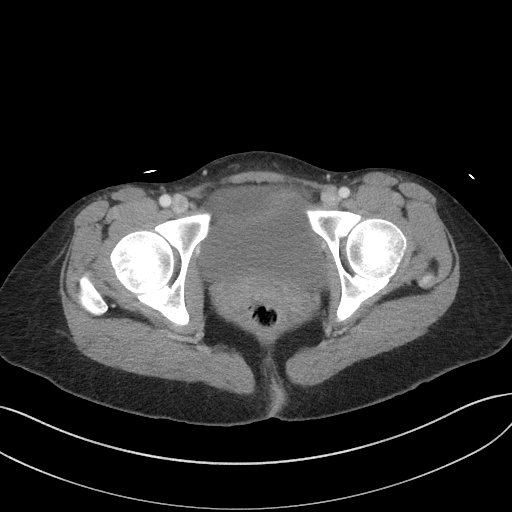
[im 20/96  soft-tissue]
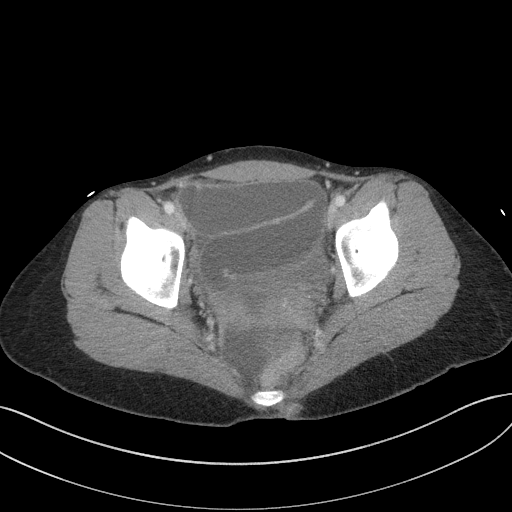
[im 29/96  soft-tissue]
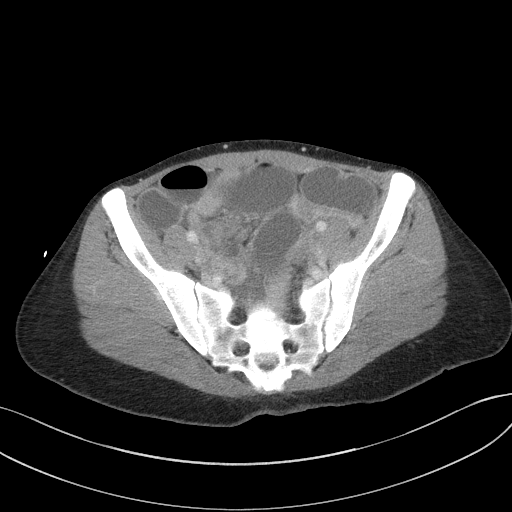
[im 34/96  soft-tissue]
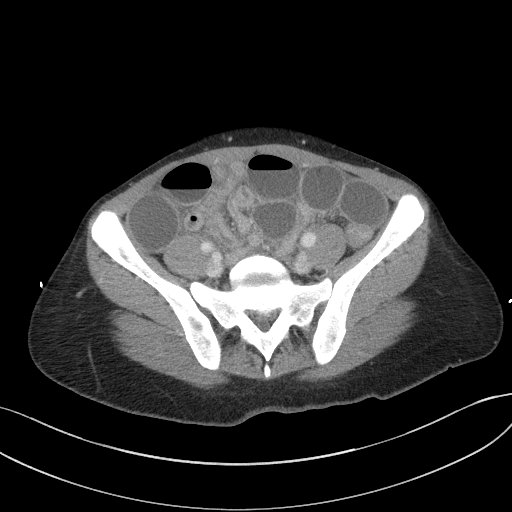
[im 43/96  soft-tissue]
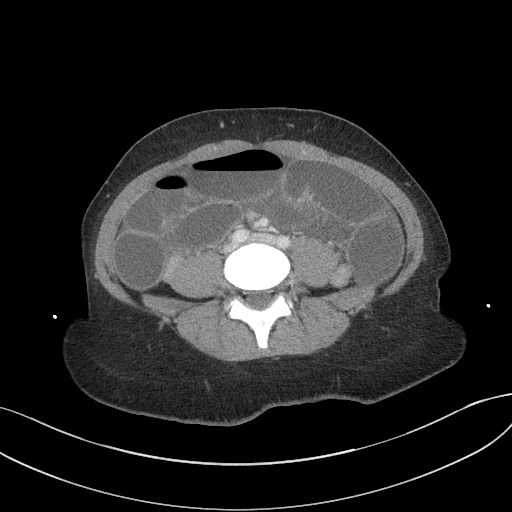
[im 48/96  soft-tissue]
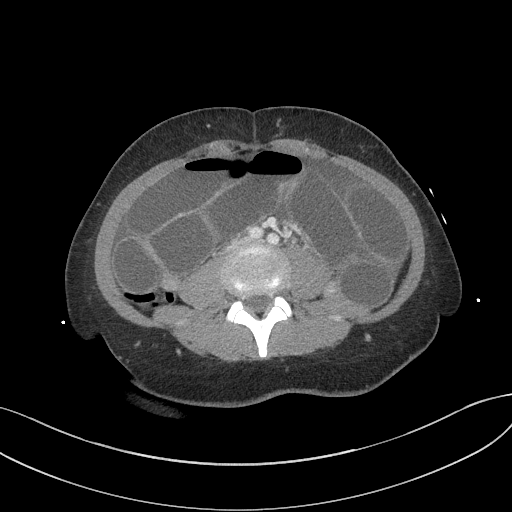
[im 53/96  soft-tissue]
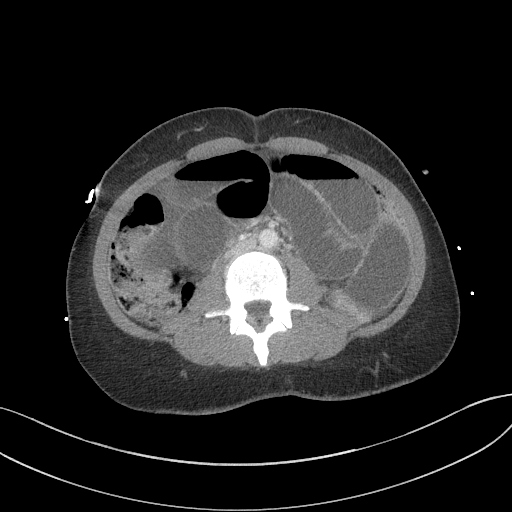
[im 62/96  soft-tissue]
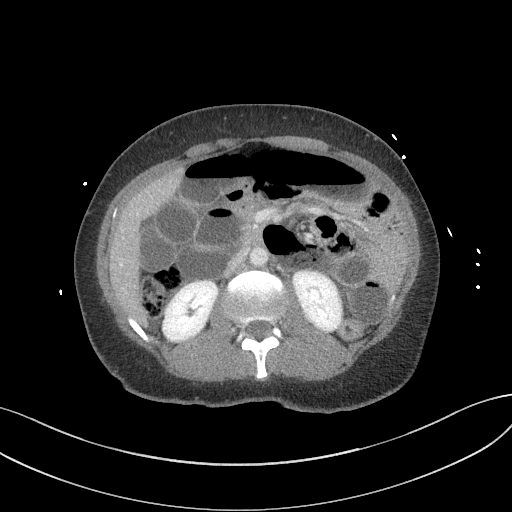
[im 62/96  bone]
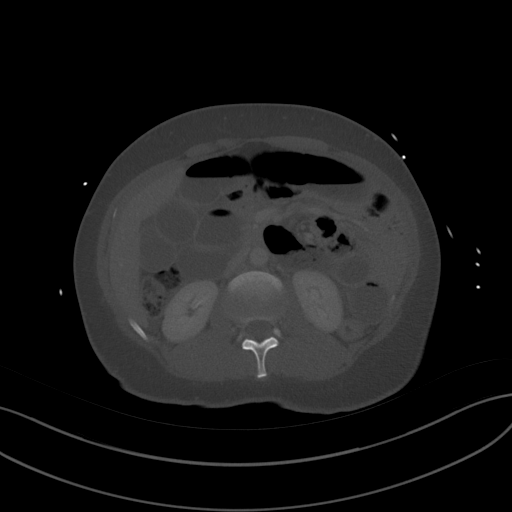
[im 67/96  soft-tissue]
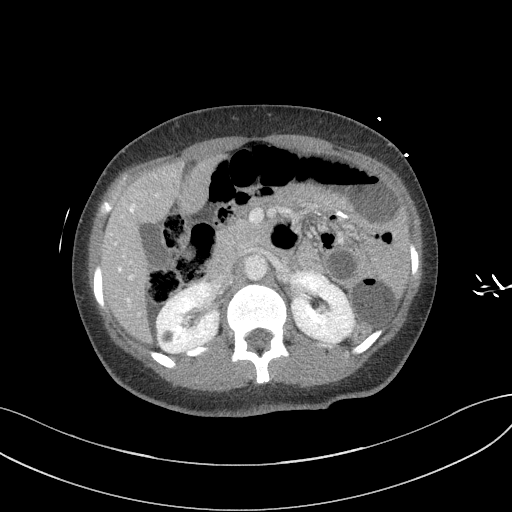
[im 77/96  soft-tissue]
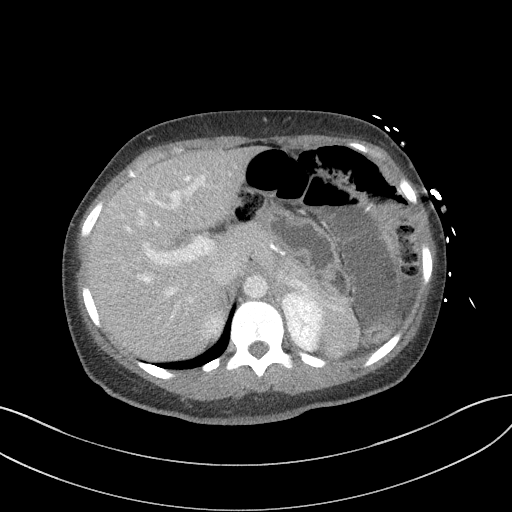
[im 81/96  soft-tissue]
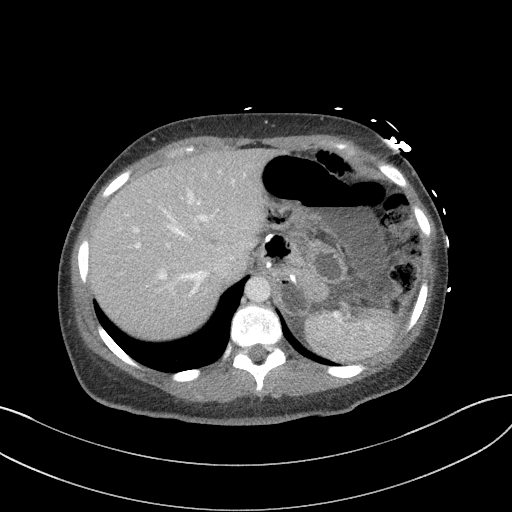
[im 91/96  soft-tissue]
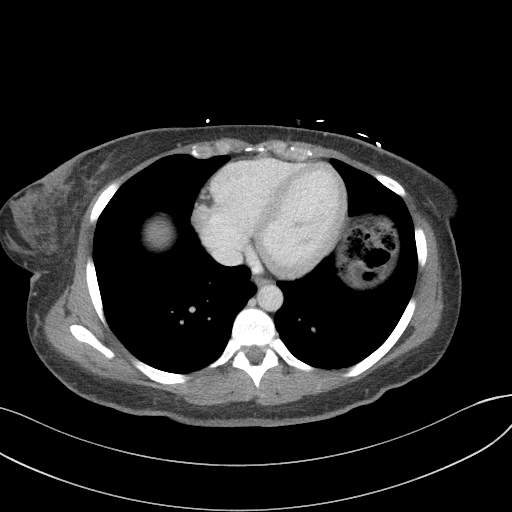

[Series 6: a/p w/ cor · coronal · 0.75mm/px · 3 of 148 slices shown]
[im 50/148  soft-tissue]
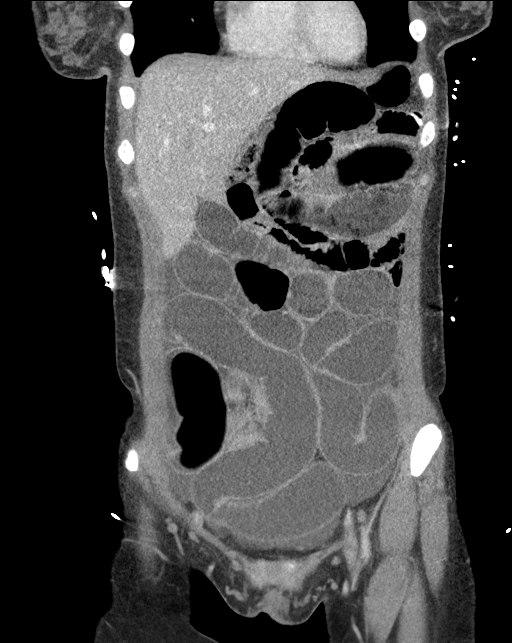
[im 66/148  soft-tissue]
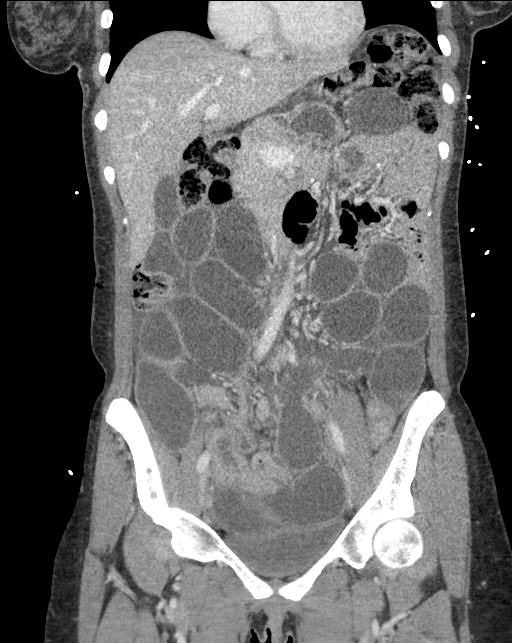
[im 82/148  soft-tissue]
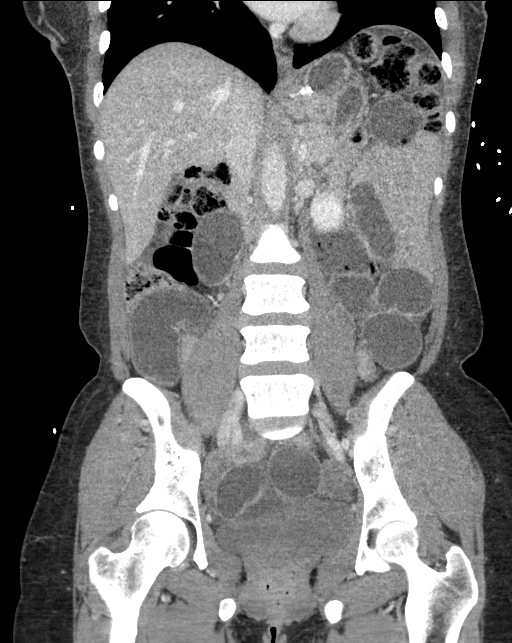

[16 of 46 positions shown; findings below may reference images not displayed]

FINDINGS: Lower chest: No acute abnormality.

Hepatobiliary: No solid liver abnormality is seen. No gallstones,
gallbladder wall thickening, or biliary dilatation.

Pancreas: Unremarkable. No pancreatic ductal dilatation or
surrounding inflammatory changes.

Spleen: Normal in size without significant abnormality.

Adrenals/Urinary Tract: Adrenal glands are unremarkable. Kidneys are
normal, without renal calculi, solid lesion, or hydronephrosis.
Bladder is unremarkable.

Stomach/Bowel: Postoperative findings of Roux type gastric bypass.
Multiple loops of diffusely fluid distended small bowel in the
central abdomen and pelvis, maximum caliber 4.0 cm, with abrupt
caliber change in the central right hemiabdomen and complete
decompression of the ileum (series 3, image 58). There is gas and
stool present in the colon to the rectum. Appendix appears normal.

Vascular/Lymphatic: No significant vascular findings are present. No
enlarged abdominal or pelvic lymph nodes.

Reproductive: Status post hysterectomy.

Other: No abdominal wall hernia or abnormality. Small volume
ascites.

Musculoskeletal: No acute or significant osseous findings.
IMPRESSION: 1. Multiple loops of diffusely fluid distended small bowel in the
central abdomen and pelvis, maximum caliber 4.0 cm, with transition
point in the central right hemiabdomen and complete decompression of
the ileum. Findings are consistent with partial or developing small
bowel obstruction given the presence of gas and stool in the colon.
2. Small volume ascites, likely reactive.
3. Postoperative findings of Roux type gastric bypass.
4. Status post hysterectomy.

## 2022-09-03 ENCOUNTER — Inpatient Hospital Stay
Admission: EM | Admit: 2022-09-03 | Discharge: 2022-09-05 | DRG: 390 | Disposition: A | Discharge status: Home / Self Care | Payer: Self-pay | Attending: Surgery | Admitting: Surgery

## 2022-09-03 ENCOUNTER — Other Ambulatory Visit: Payer: Self-pay

## 2022-09-03 DIAGNOSIS — Z9884 Bariatric surgery status: Secondary | ICD-10-CM

## 2022-09-03 DIAGNOSIS — K760 Fatty (change of) liver, not elsewhere classified: Secondary | ICD-10-CM | POA: Diagnosis present

## 2022-09-03 DIAGNOSIS — Z888 Allergy status to other drugs, medicaments and biological substances status: Secondary | ICD-10-CM

## 2022-09-03 DIAGNOSIS — K56609 Unspecified intestinal obstruction, unspecified as to partial versus complete obstruction: Principal | ICD-10-CM | POA: Diagnosis present

## 2022-09-03 DIAGNOSIS — Z79899 Other long term (current) drug therapy: Secondary | ICD-10-CM

## 2022-09-03 DIAGNOSIS — Z8249 Family history of ischemic heart disease and other diseases of the circulatory system: Secondary | ICD-10-CM

## 2022-09-03 DIAGNOSIS — Z90710 Acquired absence of both cervix and uterus: Secondary | ICD-10-CM

## 2022-09-03 DIAGNOSIS — Z9079 Acquired absence of other genital organ(s): Secondary | ICD-10-CM

## 2022-09-03 DIAGNOSIS — F10129 Alcohol abuse with intoxication, unspecified: Secondary | ICD-10-CM | POA: Diagnosis present

## 2022-09-03 DIAGNOSIS — Z823 Family history of stroke: Secondary | ICD-10-CM

## 2022-09-03 DIAGNOSIS — Y906 Blood alcohol level of 120-199 mg/100 ml: Secondary | ICD-10-CM | POA: Diagnosis present

## 2022-09-03 DIAGNOSIS — Z809 Family history of malignant neoplasm, unspecified: Secondary | ICD-10-CM

## 2022-09-03 MED ORDER — FENTANYL CITRATE PF 50 MCG/ML IJ SOSY: 50.0000 ug | PREFILLED_SYRINGE | Freq: Once | INTRAMUSCULAR | Status: DC

## 2022-09-03 MED ORDER — LACTATED RINGERS IV BOLUS
1000.0000 mL | Freq: Once | INTRAVENOUS | Status: AC
  Administered 2022-09-03: 1000 mL via INTRAVENOUS

## 2022-09-03 MED ORDER — FENTANYL CITRATE PF 50 MCG/ML IJ SOSY: 100.0000 ug | PREFILLED_SYRINGE | Freq: Once | INTRAMUSCULAR | Status: DC

## 2022-09-03 MED ORDER — METOCLOPRAMIDE HCL 5 MG/ML IJ SOLN
10.0000 mg | Freq: Once | INTRAMUSCULAR | Status: AC
  Administered 2022-09-03: 10 mg via INTRAVENOUS
  Filled 2022-09-03: qty 2

## 2022-09-03 NOTE — ED Triage Notes (Signed)
Patient to [facility] via [facility] from home.  Per EMS patient with complaint of abdominal pain.  On their arrival patient was alert and oriented x 4 and they smelled Etoh on patients breath.  Patient with hx of gastroparesis.  Per EMS has one episode of nausea enroute.  EMS interventions -- cbg 140, showing sinus brady on cardiac monitor, rr 18, bp 142/76, pulse oxi 98% on room air.

## 2022-09-03 NOTE — ED Provider Notes (Signed)
   [facility] Provider Note    Event Date/Time   First MD Initiated Contact with Patient 09/03/22 2331     (approximate)   History   No chief complaint on file.   HPI  Haley Martinez is a 41 y.o. female who presents to the ED for evaluation of No chief complaint on file.   Reviewing medical DC summary from 05/29/21.  History of remote gastric bypass.  This admission was for an SBO managed medically.  Patient presents to the ED for evaluation of 2 days of persistent generalized abdominal pain with nausea and emesis.  Reports her last bowel movement was "a while ago."  And refuses to elaborate on an actual timeframe.  Denies fever or urinary symptoms.  Physical Exam   Triage Vital Signs: ED Triage Vitals  Enc Vitals Group     BP      Pulse      Resp      Temp      Temp src      SpO2      Weight      Height      Head Circumference      Peak Flow      Pain Score      Pain Loc      Pain Edu?      Excl. in [place]?     Most recent vital signs: There were no vitals filed for this visit.  General: Awake, no distress.  Laying on her side and silent, seemingly asleep.  Refuses to turn over for my evaluation.  After I engage with her and attempted to get a history, she starts moaning and is difficult. CV:  Good peripheral perfusion.  Resp:  Normal effort.  Abd:  No distention.  Soft throughout.  Poorly localizing tenderness throughout that is inconsistent.  No guarding or peritoneal features. MSK:  No deformity noted.  Neuro:  No focal deficits appreciated. Other:     ED Results / Procedures / Treatments   Labs (all labs ordered are listed, but only abnormal results are displayed) Labs Reviewed - No data to display  EKG ***  RADIOLOGY ***  Official radiology report(s): No results found.  PROCEDURES and INTERVENTIONS:  Procedures  Medications - No data to display   IMPRESSION / MDM / [place] / ED COURSE  I reviewed the  triage vital signs and the nursing notes.  Differential diagnosis includes, but is not limited to, ***  {Patient presents with symptoms of an acute illness or injury that is potentially life-threatening.}      FINAL CLINICAL IMPRESSION(S) / ED DIAGNOSES   Final diagnoses:  None     Rx / DC Orders   ED Discharge Orders     None        Note:  This document was prepared using Dragon voice recognition software and may include unintentional dictation errors.

## 2022-09-04 ENCOUNTER — Emergency Department: Payer: Self-pay

## 2022-09-04 ENCOUNTER — Inpatient Hospital Stay: Payer: Self-pay

## 2022-09-04 LAB — LIPASE, BLOOD: Lipase: 31 U/L (ref 11–51)

## 2022-09-04 LAB — COMPREHENSIVE METABOLIC PANEL
ALT: 14 U/L (ref 0–44)
AST: 31 U/L (ref 15–41)
Albumin: 4.1 g/dL (ref 3.5–5.0)
Alkaline Phosphatase: 56 U/L (ref 38–126)
Anion gap: 9 (ref 5–15)
BUN: 11 mg/dL (ref 6–20)
CO2: 22 mmol/L (ref 22–32)
Calcium: 8.7 mg/dL — ABNORMAL LOW (ref 8.9–10.3)
Chloride: 105 mmol/L (ref 98–111)
Creatinine, Ser: 0.61 mg/dL (ref 0.44–1.00)
GFR, Estimated: >60 mL/min (ref >60)
Glucose, Bld: 101 mg/dL — ABNORMAL HIGH (ref 70–99)
Potassium: 4.2 mmol/L (ref 3.5–5.1)
Sodium: 136 mmol/L (ref 135–145)
Total Bilirubin: 0.5 mg/dL (ref 0.3–1.2)
Total Protein: 7.3 g/dL (ref 6.5–8.1)

## 2022-09-04 LAB — URINALYSIS, ROUTINE W REFLEX MICROSCOPIC
Bilirubin Urine: NEGATIVE
Glucose, UA: NEGATIVE mg/dL
Hgb urine dipstick: NEGATIVE
Ketones, ur: NEGATIVE mg/dL
Leukocytes,Ua: NEGATIVE
Nitrite: NEGATIVE
Protein, ur: NEGATIVE mg/dL
Specific Gravity, Urine: >1.046 — ABNORMAL HIGH (ref 1.005–1.030)
pH: 5 (ref 5.0–8.0)

## 2022-09-04 LAB — CBC WITH DIFFERENTIAL/PLATELET
Abs Immature Granulocytes: 0.02 10*3/uL (ref 0.00–0.07)
Basophils Absolute: 0 10*3/uL (ref 0.0–0.1)
Basophils Relative: 0 %
Eosinophils Absolute: 0 10*3/uL (ref 0.0–0.5)
Eosinophils Relative: 0 %
HCT: 34.9 % — ABNORMAL LOW (ref 36.0–46.0)
Hemoglobin: 10.4 g/dL — ABNORMAL LOW (ref 12.0–15.0)
Immature Granulocytes: 1 %
Lymphocytes Relative: 22 %
Lymphs Abs: 1 10*3/uL (ref 0.7–4.0)
MCH: 24.1 pg — ABNORMAL LOW (ref 26.0–34.0)
MCHC: 29.8 g/dL — ABNORMAL LOW (ref 30.0–36.0)
MCV: 80.8 fL (ref 80.0–100.0)
Monocytes Absolute: 0.1 10*3/uL (ref 0.1–1.0)
Monocytes Relative: 3 %
Neutro Abs: 3.3 10*3/uL (ref 1.7–7.7)
Neutrophils Relative %: 74 %
Platelets: 440 10*3/uL — ABNORMAL HIGH (ref 150–400)
RBC: 4.32 MIL/uL (ref 3.87–5.11)
RDW: 18.8 % — ABNORMAL HIGH (ref 11.5–15.5)
WBC: 4.4 10*3/uL (ref 4.0–10.5)
nRBC: 0 % (ref 0.0–0.2)

## 2022-09-04 LAB — CBC
HCT: 31.4 % — ABNORMAL LOW (ref 36.0–46.0)
Hemoglobin: 9.7 g/dL — ABNORMAL LOW (ref 12.0–15.0)
MCH: 24.3 pg — ABNORMAL LOW (ref 26.0–34.0)
MCHC: 30.9 g/dL (ref 30.0–36.0)
MCV: 78.5 fL — ABNORMAL LOW (ref 80.0–100.0)
Platelets: 399 10*3/uL (ref 150–400)
RBC: 4 MIL/uL (ref 3.87–5.11)
RDW: 18 % — ABNORMAL HIGH (ref 11.5–15.5)
WBC: 6.7 10*3/uL (ref 4.0–10.5)
nRBC: 0 % (ref 0.0–0.2)

## 2022-09-04 LAB — ETHANOL: Alcohol, Ethyl (B): 182 mg/dL — ABNORMAL HIGH (ref <10)

## 2022-09-04 LAB — CREATININE, SERUM
Creatinine, Ser: 0.61 mg/dL (ref 0.44–1.00)
GFR, Estimated: >60 mL/min (ref >60)

## 2022-09-04 LAB — HIV ANTIBODY (ROUTINE TESTING W REFLEX): HIV Screen 4th Generation wRfx: NONREACTIVE

## 2022-09-04 MED ORDER — ENOXAPARIN SODIUM 40 MG/0.4ML IJ SOSY
40.0000 mg | PREFILLED_SYRINGE | INTRAMUSCULAR | Status: DC
  Administered 2022-09-05: 40 mg via SUBCUTANEOUS
  Filled 2022-09-04: qty 0.4

## 2022-09-04 MED ORDER — ONDANSETRON 4 MG PO TBDP
4.0000 mg | ORAL_TABLET | Freq: Four times a day (QID) | ORAL | Status: DC | PRN
Start: 2022-09-04 — End: 2022-09-05

## 2022-09-04 MED ORDER — DOCUSATE SODIUM 100 MG PO CAPS
100.0000 mg | ORAL_CAPSULE | Freq: Two times a day (BID) | ORAL | Status: DC | PRN
Start: 2022-09-04 — End: 2022-09-05

## 2022-09-04 MED ORDER — DIATRIZOATE MEGLUMINE & SODIUM 66-10 % PO SOLN
90.0000 mL | Freq: Once | ORAL | Status: AC
  Administered 2022-09-04: 90 mL via ORAL

## 2022-09-04 MED ORDER — TRAMADOL HCL 50 MG PO TABS
50.0000 mg | ORAL_TABLET | Freq: Four times a day (QID) | ORAL | Status: DC | PRN
  Administered 2022-09-04: 50 mg via ORAL
  Filled 2022-09-04: qty 1

## 2022-09-04 MED ORDER — THIAMINE MONONITRATE 100 MG PO TABS
100.0000 mg | ORAL_TABLET | Freq: Every day | ORAL | Status: DC
  Administered 2022-09-04 – 2022-09-05 (×2): 100 mg via ORAL
  Filled 2022-09-04 (×2): qty 1

## 2022-09-04 MED ORDER — FOLIC ACID 1 MG PO TABS
1.0000 mg | ORAL_TABLET | Freq: Every day | ORAL | Status: DC
  Administered 2022-09-04 – 2022-09-05 (×2): 1 mg via ORAL
  Filled 2022-09-04 (×2): qty 1

## 2022-09-04 MED ORDER — THIAMINE HCL 100 MG/ML IJ SOLN: 100.0000 mg | Freq: Every day | INTRAMUSCULAR | Status: DC

## 2022-09-04 MED ORDER — HYDROCODONE-ACETAMINOPHEN 5-325 MG PO TABS
1.0000 | ORAL_TABLET | ORAL | Status: DC | PRN
  Administered 2022-09-04 – 2022-09-05 (×2): 2 via ORAL
  Filled 2022-09-04 (×2): qty 2

## 2022-09-04 MED ORDER — LORAZEPAM 1 MG PO TABS: 1.0000 mg | ORAL_TABLET | ORAL | Status: DC | PRN

## 2022-09-04 MED ORDER — ONDANSETRON HCL 4 MG/2ML IJ SOLN: 4.0000 mg | Freq: Four times a day (QID) | INTRAMUSCULAR | Status: DC | PRN

## 2022-09-04 MED ORDER — ADULT MULTIVITAMIN W/MINERALS CH
1.0000 | ORAL_TABLET | Freq: Every day | ORAL | Status: DC
  Administered 2022-09-04 – 2022-09-05 (×2): 1 via ORAL
  Filled 2022-09-04 (×2): qty 1

## 2022-09-04 MED ORDER — MORPHINE SULFATE (PF) 2 MG/ML IV SOLN: 2.0000 mg | INTRAVENOUS | Status: DC | PRN

## 2022-09-04 MED ORDER — IOHEXOL 300 MG/ML  SOLN
100.0000 mL | Freq: Once | INTRAMUSCULAR | Status: AC | PRN
  Administered 2022-09-04: 100 mL via INTRAVENOUS

## 2022-09-04 MED ORDER — LORAZEPAM 2 MG/ML IJ SOLN: 1.0000 mg | INTRAMUSCULAR | Status: DC | PRN

## 2022-09-04 NOTE — H&P (Signed)
Subjective:   CC: SBO  HPI:  Haley Martinez is a 41 y.o. female who was consulted by Tamala Julian for issue above.  Symptoms were first noted 2 days ago. Pain is sharp, abdominal.  Associated with nausea, exacerbated by nothing.  Last BM "some time ago"     Past Medical History:  has a past medical history of Anemia, GERD (gastroesophageal reflux disease), Headache (05/22/2014), History of blood transfusion (04/2014), Insomnia, Polycystic ovarian syndrome, and Vaginal delivery (2000).  Past Surgical History:  Past Surgical History:  Procedure Laterality Date   BILATERAL SALPINGECTOMY Bilateral 01/16/2017   Procedure: BILATERAL SALPINGECTOMY;  Surgeon: Aletha Halim, MD;  Location: [facility];  Service: Gynecology;  Laterality: Bilateral;   BREAST CYST ASPIRATION Right    CYSTOSCOPY N/A 01/16/2017   Procedure: CYSTOSCOPY;  Surgeon: Aletha Halim, MD;  Location: [facility];  Service: Gynecology;  Laterality: N/A;   GASTRIC BYPASS  2004   REPAIR VAGINAL CUFF N/A 07/20/2017   Procedure: Excision of Granulation Tissue from Vaginal cuff;  Surgeon: Aletha Halim, MD;  Location: [facility];  Service: Gynecology;  Laterality: N/A;   VAGINAL HYSTERECTOMY Bilateral 01/16/2017   Procedure: HYSTERECTOMY VAGINAL;  Surgeon: Aletha Halim, MD;  Location: [facility];  Service: Gynecology;  Laterality: Bilateral;   WISDOM TOOTH EXTRACTION      Family History: family history includes Cancer in her maternal uncle; Drug abuse in her maternal grandmother and paternal grandmother; Heart disease in her mother; Hyperlipidemia in her father and mother; Hypertension in her father and mother; Stroke in her paternal grandfather.  Social History:  reports that she has never smoked. She has never used smokeless tobacco. She reports current alcohol use of about 14.0 standard drinks of alcohol per week. She reports that she does not use drugs.  Current Medications:  Prior to Admission medications   Medication Sig Start Date End Date  Taking? Authorizing Provider  acetaminophen (TYLENOL) 500 MG tablet Take 1,500 mg by mouth every 6 (six) hours as needed for mild pain or headache.    [provider]  Aspirin-Acetaminophen-Caffeine (GOODY HEADACHE PO) Take 1 packet by mouth 2 (two) times daily as needed (headache).    [provider]  ferrous sulfate 300 (60 Fe) MG/5ML syrup Take 5 mLs (300 mg total) by mouth 2 (two) times daily with a meal. 05/29/21 06/28/21  Patrecia Pour, MD  Multiple Vitamins-Minerals (MULTIVITAMIN WITH MINERALS) tablet Take 1 tablet by mouth daily.    [provider]  Nutritional Supplements (DETOX ENZYME FORMULA PO) Take 1 tablet by mouth daily.    [provider]  ondansetron (ZOFRAN) 4 MG tablet Take 1 tablet (4 mg total) by mouth every 6 (six) hours. 05/29/21   Patrecia Pour, MD  vitamin B-12 (CYANOCOBALAMIN) 1000 MCG tablet Take 1 tablet (1,000 mcg total) by mouth daily. 05/29/21   Patrecia Pour, MD    Allergies:  Allergies as of 09/03/2022 - Review Complete 09/03/2022  Allergen Reaction Noted   Trazodone hcl  05/15/2014   Nsaids  05/27/2021   Trazodone and nefazodone  05/15/2014    ROS:  General: Denies weight loss, weight gain, fatigue, fevers, chills, and night sweats. Eyes: Denies blurry vision, double vision, eye pain, itchy eyes, and tearing. Ears: Denies hearing loss, earache, and ringing in ears. Nose: Denies sinus pain, congestion, infections, runny nose, and nosebleeds. Mouth/throat: Denies hoarseness, sore throat, bleeding gums, and difficulty swallowing. Heart: Denies chest pain, palpitations, racing heart, irregular heartbeat, leg pain or swelling, and decreased  activity tolerance. Respiratory: Denies breathing difficulty, shortness of breath, wheezing, cough, and sputum. GI: Denies change in appetite, heartburn, vomiting, constipation, diarrhea, and blood in stool. GU: Denies difficulty urinating, pain with urinating, urgency, frequency, blood in  urine. Musculoskeletal: Denies joint stiffness, pain, swelling, muscle weakness. Skin: Denies rash, itching, mass, tumors, sores, and boils Neurologic: Denies headache, fainting, dizziness, seizures, numbness, and tingling. Psychiatric: Denies depression, anxiety, difficulty sleeping, and memory loss. Endocrine: Denies heat or cold intolerance, and increased thirst or urination. Blood/lymph: Denies easy bruising, easy bruising, and swollen glands     Objective:     BP 125/74 (BP Location: Left Arm)   Pulse 60   Temp 98 F (36.7 C) (Oral)   Resp 17   Ht '5\' 3"'$  (1.6 m)   Wt 68 kg   LMP 12/18/2016 (Exact Date)   SpO2 100%   BMI 26.57 kg/m   Constitutional :  cooperative, appears stated age, no distress, and somnolent  Lymphatics/Throat:  no asymmetry, masses, or scars  Respiratory:  clear to auscultation bilaterally  Cardiovascular:  regular rate and rhythm  Gastrointestinal: soft, non-tender; bowel sounds normal; no masses,  no organomegaly.   Musculoskeletal: Steady movement  Skin: Cool and moist,    Psychiatric: Normal affect, non-agitated, not confused       LABS:     Latest Ref Rng & Units 09/03/2022   11:49 PM 05/29/2021   12:31 AM 05/28/2021    4:54 AM  CMP  Glucose 70 - 99 mg/dL 101  91  85   BUN 6 - 20 mg/dL '11  6  10   '$ Creatinine 0.44 - 1.00 mg/dL 0.61  0.58  0.61   Sodium 135 - 145 mmol/L 136  137  137   Potassium 3.5 - 5.1 mmol/L 4.2  4.1  4.0   Chloride 98 - 111 mmol/L 105  107  107   CO2 22 - 32 mmol/L '22  25  24   '$ Calcium 8.9 - 10.3 mg/dL 8.7  8.3  8.4   Total Protein 6.5 - 8.1 g/dL 7.3     Total Bilirubin 0.3 - 1.2 mg/dL 0.5     Alkaline Phos 38 - 126 U/L 56     AST 15 - 41 U/L 31     ALT 0 - 44 U/L 14         Latest Ref Rng & Units 09/03/2022   11:49 PM 05/29/2021   12:31 AM 05/28/2021    4:54 AM  CBC  WBC 4.0 - 10.5 K/uL 4.4  4.1  5.1   Hemoglobin 12.0 - 15.0 g/dL 10.4  8.6  9.0   Hematocrit 36.0 - 46.0 % 34.9  29.0  29.0   Platelets 150 - 400  K/uL 440  331  336     RADS: CLINICAL DATA:  Diffuse abdominal pain, history of prior gastric bypass   EXAM: CT ABDOMEN AND PELVIS WITH CONTRAST   TECHNIQUE: Multidetector CT imaging of the abdomen and pelvis was performed using the standard protocol following bolus administration of intravenous contrast.   RADIATION DOSE REDUCTION: This exam was performed according to the departmental dose-optimization program which includes automated exposure control, adjustment of the mA and/or kV according to patient size and/or use of iterative reconstruction technique.   CONTRAST:  158m OMNIPAQUE IOHEXOL 300 MG/ML  SOLN   COMPARISON:  05/27/2021   FINDINGS: Lower chest: Lung bases are free of acute infiltrate or sizable effusion.   Hepatobiliary: Liver demonstrates diffuse decreased attenuation  consistent with fatty infiltration. Perihepatic fluid is noted new from the prior exam. The gallbladder is within normal limits.   Pancreas: Unremarkable. No pancreatic ductal dilatation or surrounding inflammatory changes.   Spleen: Normal in size without focal abnormality. Perisplenic fluid is seen as well.   Adrenals/Urinary Tract: Adrenal glands are within normal limits. Kidneys demonstrate a normal enhancement pattern with the exception of a 1 cm hypodensity consistent with simple cyst in the right kidney. This is stable from the prior exam. No follow-up is recommended. Normal excretion is seen on delayed images. The bladder is partially distended.   Stomach/Bowel: Colon is predominately decompressed. No obstructive changes are seen. The appendix is not well visualized. Stomach shows changes of prior gastric bypass. Fluid-filled loops of small bowel are noted as well as several loops of small bowel which demonstrate increased fecalization of bowel contents. There is a transition zone identified in the mid abdomen best seen on image number 26 of series 5 and image number 68 of  series 2. This is likely related to underlying adhesions.   Vascular/Lymphatic: No significant vascular findings are present. No enlarged abdominal or pelvic lymph nodes.   Reproductive: Status post hysterectomy. No adnexal masses.   Other: Mild free fluid is noted within the abdomen surrounding the liver and spleen as well as to a lesser degree within the pelvis. This may be reactive in nature although the possibility of an underlying perforation of small-bowel deserves consideration. No free air is seen.   Musculoskeletal: No acute bony abnormality noted.   IMPRESSION: Postoperative changes consistent with prior gastric bypass. There are multiple dilated loops of small bowel containing fluid and fecalized material with a transition zone identified in the mid abdomen as described. These changes are likely related to underlying adhesions given the prior surgery. Free fluid is noted surrounding the liver and spleen as well as to a lesser degree within the pelvis suspicious for underlying perforation.   Critical Value/emergent results were called by telephone at the time of interpretation on 09/04/2022 at 1:07 am to Dr. Vladimir Crofts , who verbally acknowledged these results.     Electronically Signed   By: Inez Catalina M.D.   On: 09/04/2022 01:07 Assessment:   SBO Fluid in abdomen  Hx of alcohol use  Plan:   Admit, NPO, IVF, SBFT, CIWA protocol  The patient verbalized understanding and all questions were answered to the patient's satisfaction.  labs/images/medications/previous chart entries reviewed personally and relevant changes/updates noted above.

## 2022-09-04 NOTE — Discharge Instructions (Signed)
                  Intensive Outpatient Programs  [facility]    [facility] [address]     [address] [place],  [place]     [place], [place] [phone]      [phone]  [facility]   [facility]  (Inpatient and outpatient)  [phone] (Suboxone and Methadone) [address]           [phone]           [facility]    [facility] - Intensive Outpatient [address]     [address] [place], [place] [address]     [place], [place]  [phone]      [phone]  [facility] (Outpatient, Inpatient, [facility]  [facility] (Groups and [facility]idental) (insurance only) [phone]    [place], [place]          [phone]       [facility]    [facility] (for caregivers and family) [address]     [address] [place], [place]     [place], [place] [phone]      [phone]  Residential Treatment Programs  [facility]  [facility](2 years) Residential: 90 days)  [facility] [address]      [address] [place], [place]     [place], [place] [phone]      [phone] or [phone]  [facility]    [facility] [address]      [address] [place], [place]     [place], [place] [phone]      [phone]  [facility]   [facility] ([facility]) [address]     [address] [place], [place] [address]     [place], [place] [phone]      [phone] Admissions: 8am-3pm M-F  [facility]: Residential Program (90 Days)              [facility]: [facility]  [place], [place]     [place], [place]  [phone] or [phone]    (Walk in Hours over the weekend or by referral)   [facility]: [facility]:[phone] (for crisis  response 24 hours a day) 

## 2022-09-04 NOTE — TOC Initial Note (Signed)
Transition of Care [facility]) - Initial/Assessment Note    Patient Details  Name: Haley Martinez MRN: [id number] Date of Birth: Apr 04, 1981  [facility]) CM/SW Contact:    Quin Hoop, LCSW Phone Number: 09/04/2022, 10:01 AM  Clinical Narrative:                 CSW completed chart review.  Consult placed for substance abuse resources.  CSW provided resources in pt AVS.  CSW noticed that pt had no insurance on file.  CSW contacted [facility] Peer to request Medicaid screening.    TOC will continue to follow for any needs that may arise.  Expected Discharge Plan:  (unknown at this time) Barriers to Discharge: Continued Medical Work up   Patient Goals and CMS Choice Patient states their goals for this hospitalization and ongoing recovery are:: to go home      Expected Discharge Plan and Services Expected Discharge Plan:  (unknown at this time)       Living arrangements for the past 2 months: [place]                                      Prior Living Arrangements/Services Living arrangements for the past 2 months: Single Family Home                     Activities of Daily Living      Permission Sought/Granted                  Emotional Assessment       Orientation: : Oriented to Self, Oriented to Place, Oriented to  Time, Oriented to Situation      Admission diagnosis:  Small bowel obstruction ([place]) [K56.609] SBO (small bowel obstruction) ([place]) [K56.609] Patient Active Problem List   Diagnosis Date Noted   SBO (small bowel obstruction) ([place]) 05/27/2021   Small bowel obstruction ([place]) 05/27/2021   Dental caries 03/19/2017   History of prediabetes 10/30/2016   Breast mass, right 10/30/2016   Abnormal weight gain 04/14/2016   PCOS (polycystic ovarian syndrome) 04/14/2016   Thrombocytosis 05/24/2014   Headache 05/22/2014   Iron deficiency anemia 05/15/2014   Other chest pain 05/15/2014   Vitamin D deficiency 05/14/2014    At risk for impaired health maintenance 04/17/2014   B12 deficiency anemia 09/09/2007   Insomnia 08/02/2007   PCP:  Pcp, No Pharmacy:   [id number] [place]Phone: [phone]: [phone] [id number] [place] [id number]: [phone]: [phone]   Social Determinants of Health (SDOH) Interventions    Readmission Risk Interventions     No data to display

## 2022-09-04 NOTE — ED Notes (Signed)
Patient finished [place], radiology notified.

## 2022-09-05 NOTE — Discharge Summary (Signed)
Physician Discharge Summary  Patient ID: RITIKA HELLICKSON MRN: [id number] DOB/AGE: March 14, 1981 41 y.o.  Admit date: 09/03/2022 Discharge date: September 05, 2022  Admission Diagnoses: Small bowel obstruction  Discharge Diagnoses:  Same as above  Discharged Condition: good  Hospital Course: admitted for above.  N.p.o., IV fluids.  Small bowel follow-through obtained.  8-hour post [place] noted contrast within the colon and patient started having multiple bowel movements.  Diet advance as tolerated.  At the time of discharge tolerating regular diet, still having multiple bowel movements, abdominal pain has resolved. Consults: None  Discharge Exam: Blood pressure 137/78, pulse 66, temperature 97.8 F (36.6 C), resp. rate 16, height '5\' 3"'$  (1.6 m), weight 68 kg, last menstrual period 12/18/2016, SpO2 100 %. General appearance: alert, cooperative, and no distress GI: soft, non-tender; bowel sounds normal; no masses,  no organomegaly  Disposition:  Discharge disposition: 01-Home or Self Care        Allergies as of 09/05/2022       Reactions   Trazodone Hcl    Other reaction(s): Other, Other (See Comments) "severe headache" "severe headache"   Nsaids    Hx of gastric bypass   Trazodone And Nefazodone    "severe headache"        Medication List     STOP taking these medications    GOODY HEADACHE PO       TAKE these medications    acetaminophen 500 MG tablet Commonly known as: TYLENOL Take 1,500 mg by mouth every 6 (six) hours as needed for mild pain or headache.   cyanocobalamin 1000 MCG tablet Commonly known as: VITAMIN B12 Take 1 tablet (1,000 mcg total) by mouth daily.   DETOX ENZYME FORMULA PO Take 1 tablet by mouth daily.   ferrous sulfate 300 (60 Fe) MG/5ML syrup Take 5 mLs (300 mg total) by mouth 2 (two) times daily with a meal.   multivitamin with minerals tablet Take 1 tablet by mouth daily.   ondansetron 4 MG tablet Commonly known as:  ZOFRAN Take 1 tablet (4 mg total) by mouth every 6 (six) hours.          Total time spent arranging discharge was >42mn. Signed: IBenjamine Sprague12/08/2022, 5:24 PM
